# Patient Record
Sex: Male | Born: 1970 | Hispanic: Yes | Marital: Single | Smoking: Former smoker
Health system: Southern US, Community
[De-identification: ages and names within clinical notes are randomized; demographics above are authoritative.]

## PROBLEM LIST (undated history)

## (undated) DIAGNOSIS — K219 Gastro-esophageal reflux disease without esophagitis: Secondary | ICD-10-CM

## (undated) DIAGNOSIS — I10 Essential (primary) hypertension: Secondary | ICD-10-CM

## (undated) DIAGNOSIS — Z0189 Encounter for other specified special examinations: Secondary | ICD-10-CM

## (undated) HISTORY — DX: Gastro-esophageal reflux disease without esophagitis: K21.9

## (undated) HISTORY — DX: Essential (primary) hypertension: I10

## (undated) HISTORY — DX: Encounter for other specified special examinations: Z01.89

---

## 2015-01-04 ENCOUNTER — Ambulatory Visit (INDEPENDENT_AMBULATORY_CARE_PROVIDER_SITE_OTHER): Payer: Managed Care, Other (non HMO) | Admitting: Osteopathic Medicine

## 2015-01-04 ENCOUNTER — Encounter: Payer: Self-pay | Admitting: Osteopathic Medicine

## 2015-01-04 VITALS — BP 122/77 | HR 79 | Temp 97.6°F | Ht 70.0 in | Wt 179.8 lb

## 2015-01-04 DIAGNOSIS — Z0189 Encounter for other specified special examinations: Secondary | ICD-10-CM

## 2015-01-04 DIAGNOSIS — Z23 Encounter for immunization: Secondary | ICD-10-CM | POA: Diagnosis not present

## 2015-01-04 DIAGNOSIS — Z0000 Encounter for general adult medical examination without abnormal findings: Secondary | ICD-10-CM | POA: Diagnosis not present

## 2015-01-04 DIAGNOSIS — Z008 Encounter for other general examination: Secondary | ICD-10-CM | POA: Insufficient documentation

## 2015-01-04 HISTORY — DX: Encounter for other general examination: Z00.8

## 2015-01-04 NOTE — Patient Instructions (Addendum)
YOU HAVE BEEN GIVEN FORMS TO HAVE LAB TESTS DONE, PLEASE GET THESE DONE FASTING (NO FOOD FOR 8 HOURS). THE LAB IS ON THE LOWER LEVEL OF THIS BUILDING AND IS OPEN 8:00 AM - 5:00 PM. WE WILL CALL YOU WITH THE RESULTS OF YOUR LAB WORK, AND WE WILL FINISH FILLING OUT THE PAPERWORK AND SEND IT TO YOUR INSURANCE COMPANY FOR YOU. PLEASE LET Alex Wyatt KNOW IF YOU HAVE NOT HEARD BACK ABOUT YOUR LAB TESTS IN ONE WEEK.   CALL YOUR INSURANCE COMPANY TO ASK IF THE PNEUMONIA VACCINE IS COVERED - SINCE YOU SMOKE CIGARETTES IT IS RECOMMENDED THAT YOU GET THIS VACCINE.   DR. Jonn ShinglesHARLES STANFIELD IS A RECOMMENDED DENTIST IN [place] (CLOSE TO [place]). TRY CALLING HIS OFFICE TO ASK ABOUT COVERAGE AND TO SCHEDULE AN APPOINTMENT [phone].  PLEASE CALL OUR OFFICE WITH ANY QUESTIONS OR CONCERNS!

## 2015-01-04 NOTE — Progress Notes (Signed)
HPI: Alex Wyatt is a 44 y.o. male who presents to [facility]  today for chief complaint of:  Chief Complaint  Patient presents with  . New Patient (Initial Visit)    Needs lab work   No complaints. Here for biometric screening for work.  Preventive care reviewed as below.    Past medical, social and family history reviewed: No past medical history on file. No past surgical history on file. Social History  Substance Use Topics  . Smoking status: Never Smoker   . Smokeless tobacco: Not on file  . Alcohol Use: Not on file   No family history on file.  No current outpatient prescriptions on file.   No current facility-administered medications for this visit.   No Known Allergies    Review of Systems: CONSTITUTIONAL:  No  fever, no chills, No  unintentional weight changes HEAD/EYES/EARS/NOSE/THROAT: No headache, no vision change, no hearing change, No  sore throat CARDIAC: No chest pain, no pressure/palpitations, no orthopnea RESPIRATORY: No  cough, No  shortness of breath/wheeze GASTROINTESTINAL: No nausea, no vomiting, no abdominal pain, no blood in stool, no diarrhea, no constipation MUSCULOSKELETAL: No  myalgia/arthralgia GENITOURINARY: No incontinence, No abnormal genital bleeding/discharge SKIN: No rash/wounds/concerning lesions HEM/ONC: No easy bruising/bleeding, no abnormal lymph node ENDOCRINE: No polyuria/polydipsia/polyphagia, no heat/cold intolerance  NEUROLOGIC: No weakness, no dizziness, no slurred speech PSYCHIATRIC: No concerns with depression, no concerns with anxiety, no sleep problems    Exam:  BP 122/77 mmHg  Pulse 79  Temp(Src) 97.6 F (36.4 C)  Ht 5' 10"  (1.778 m)  Wt 179 lb 12 oz (81.534 kg)  BMI 25.79 kg/m2 Constitutional: VSS, see above. General Appearance: alert, well-developed, well-nourished, NAD Eyes: Normal lids and conjunctive, non-icteric sclera, PERRLA Ears, Nose, Mouth, Throat: Normal external  inspection ears/nares/mouth/lips/gums, TM normal bilaterally, MMM, posterior pharynx No  erythema No exudate Neck: No masses, trachea midline. No thyroid enlargement/tenderness/mass appreciated. No lymphadenopathy Respiratory: Normal respiratory effort. no wheeze, no rhonchi, no rales Cardiovascular: S1/S2 normal, no murmur, no rub/gallop auscultated. RRR. No lower extremity edema. Gastrointestinal: Nontender, no masses. No hepatomegaly, no splenomegaly. No hernia appreciated. Bowel sounds normal. Rectal exam deferred.  Musculoskeletal: Gait normal. No clubbing/cyanosis of digits.  Neurological: No cranial nerve deficit on limited exam. Motor and sensation intact and symmetric Psychiatric: Normal judgment/insight. Normal mood and affect. Oriented x3.    No results found for this or any previous visit (from the past 72 hour(s)).    ASSESSMENT/PLAN:  Annual physical exam - Plan: Lipid panel, COMPLETE METABOLIC PANEL WITH GFR, HIV antibody  Encounter for biometric screening - Plan: Lipid panel, COMPLETE METABOLIC PANEL WITH GFR  Encounter for immunization - flu and Td given today, pt wants to check with insurance re: PNA vax    MALE PREVENTIVE CARE  ANNUAL SCREENING/COUNSELING Tobacco - Current  Smoked 1 packs per day for 30 years , now about 1 pack per week.  Alcohol - social drinker Diet/Exercise - HEALTHY HABITS DISCUSSED TO DECREASE CV RISK Sexual Health - No STI - The patient denies history of sexually transmitted disease. INTERESTED IN STI TESTING - no Depression - PQH2 Negative Domestic violence concerns - no HTN SCREENING - SEE VITALS Vaccination status - SEE BELOW  INFECTIOUS DISEASE SCREENING HIV - all adults 15-65 - needs GC/CT - sexually active - does not need HepC - born 72-1966 - does not need TB - if risk/required by employer - does not need  DISEASE SCREENING Lipid - (Low risk screen  M35; High risk screen M25 if HTN, Tob, FH CHD M<55/F<65) - needs DM2 (45+  or Risk = FH 1st deg DM, Hx GDM, overweight/sedentary, high-risk ethnicity, HTN) - needs Osteoporosis - age 33+ or one sooner if risk - does not need AAA - once age 51-75 if smoker - does not need  CANCER SCREENING Lung - annual low dose CT Chest age 23-85 w/ 30+ PY, current/quit past 15 years - CT - does not need Colon - age 38+ or 45 years of age prior to [place] Dx - GI REFERRAL - does not need Prostate - (shared decision making age 60 in average-risk, age 1 to 75 high risk black men, men with a family history of prostate cancer younger than age 37, BRCA1 or BRCA2) - does not need   ADULT VACCINATION Influenza - annual - was given Td booster every 10 years - was given HPV - age <40yo - was not indicated Zoster - age 37+ - was not indicated Pneumonia - age 45+ sooner if risk (DM, smoker, other) - was offered and declined by the patient  OTHER Fall - exercise and Vit D age 37+ - does not need Consider ASA - age 52-59 - does not need  Return in about 1 year (around 01/04/2016), or sooner if any concerns, for ANNUAL PHYSICAL EXAM.

## 2015-01-05 LAB — HIV ANTIBODY (ROUTINE TESTING W REFLEX): HIV: NONREACTIVE

## 2015-01-05 LAB — COMPLETE METABOLIC PANEL WITH GFR
ALT: 13 U/L (ref 9–46)
AST: 21 U/L (ref 10–40)
Albumin: 4 g/dL (ref 3.6–5.1)
Alkaline Phosphatase: 59 U/L (ref 40–115)
BUN: 15 mg/dL (ref 7–25)
CALCIUM: 9 mg/dL (ref 8.6–10.3)
CHLORIDE: 106 mmol/L (ref 98–110)
CO2: 25 mmol/L (ref 20–31)
CREATININE: 0.91 mg/dL (ref 0.60–1.35)
GFR, Est African American: >89 mL/min (ref >=60)
GFR, Est Non African American: >89 mL/min (ref >=60)
GLUCOSE: 88 mg/dL (ref 65–99)
POTASSIUM: 4.2 mmol/L (ref 3.5–5.3)
SODIUM: 138 mmol/L (ref 135–146)
Total Bilirubin: 0.8 mg/dL (ref 0.2–1.2)
Total Protein: 6.9 g/dL (ref 6.1–8.1)

## 2015-01-05 LAB — LIPID PANEL
CHOLESTEROL: 140 mg/dL (ref 125–200)
HDL: 40 mg/dL (ref >=40)
LDL Cholesterol: 81 mg/dL (ref <130)
Total CHOL/HDL Ratio: 3.5 Ratio (ref <=5.0)
Triglycerides: 94 mg/dL (ref <150)
VLDL: 19 mg/dL (ref <30)

## 2015-01-07 NOTE — Progress Notes (Signed)
Left message to call back  

## 2016-05-09 ENCOUNTER — Encounter: Payer: Self-pay | Admitting: Osteopathic Medicine

## 2016-05-09 ENCOUNTER — Ambulatory Visit (INDEPENDENT_AMBULATORY_CARE_PROVIDER_SITE_OTHER): Payer: Managed Care, Other (non HMO) | Admitting: Osteopathic Medicine

## 2016-05-09 VITALS — BP 124/76 | HR 74 | Ht 70.0 in | Wt 190.0 lb

## 2016-05-09 DIAGNOSIS — Z0189 Encounter for other specified special examinations: Secondary | ICD-10-CM | POA: Diagnosis not present

## 2016-05-09 DIAGNOSIS — Z0000 Encounter for general adult medical examination without abnormal findings: Secondary | ICD-10-CM | POA: Diagnosis not present

## 2016-05-09 DIAGNOSIS — Z008 Encounter for other general examination: Secondary | ICD-10-CM

## 2016-05-09 NOTE — Progress Notes (Addendum)
HPI: Alex Wyatt is a 46 y.o. male  who presents to [facility] today, 05/09/16,  for chief complaint of:  Chief Complaint  Patient presents with  . Annual Exam     Patient presents to clinic for annual physical/biometric screening as required by employer. No complaints today.  No major health changes since last visit other than he has quit smoking. No changes to family history.  Past medical, surgical, social and family history reviewed: Patient Active Problem List   Diagnosis Date Noted  . Encounter for biometric screening 01/04/2015   No past surgical history on file. Social History  Substance Use Topics  . Smoking status: Current Every Day Smoker  . Smokeless tobacco: Current User  . Alcohol use Not on file   No family history on file.   Current medication list and allergy/intolerance information reviewed:   No current outpatient prescriptions on file.   No current facility-administered medications for this visit.    No Known Allergies    Review of Systems:  Constitutional:  No  fever, no chills, No recent illness, No unintentional weight changes. No significant fatigue.   HEENT: No  headache, no vision change, no hearing change, No sore throat, No  sinus pressure   Cardiac: No  chest pain, No  pressure, No palpitations  Respiratory:  No  shortness of breath. No  Cough  Gastrointestinal: No  abdominal pain, No  nausea, No  vomiting,  No  blood in stool, No  diarrhea, No  constipation   Musculoskeletal: No new myalgia/arthralgia  Skin: No  Rash,  Neurologic: No  weakness, No  dizziness  Psychiatric: No  concerns with depression, No  concerns with anxiety  Exam:  BP 124/76   Pulse 74   Ht 5\' 10"  (1.778 m)   Wt 190 lb (86.2 kg)   BMI 27.26 kg/m   Constitutional: VS see above. General Appearance: alert, well-developed, well-nourished, NAD  Eyes: Normal lids and conjunctive, non-icteric sclera  Ears, Nose, Mouth,  Throat: MMM, Normal external inspection ears/nares/mouth/lips/gums. TM normal bilaterally. Pharynx/tonsils no erythema, no exudate. Nasal mucosa normal.   Neck: No masses, trachea midline. No thyroid enlargement. No tenderness/mass appreciated. No lymphadenopathy  Respiratory: Normal respiratory effort. no wheeze, no rhonchi, no rales  Cardiovascular: S1/S2 normal, no murmur, no rub/gallop auscultated. RRR. No lower extremity edema.   Gastrointestinal: Nontender, no masses. No hepatomegaly, no splenomegaly. No hernia appreciated. Bowel sounds normal. Rectal exam deferred.   Musculoskeletal: Gait normal. No clubbing/cyanosis of digits.   Neurological: Normal balance/coordination. No tremor  Skin: warm, dry, intact. No rash/ulcer. No concerning nevi or subq nodules on limited exam.    Psychiatric: Normal judgment/insight. Normal mood and affect. Oriented x3.      ASSESSMENT/PLAN:    Encounter for biometric screening - Plan: COMPLETE METABOLIC PANEL WITH GFR, Lipid panel  Annual physical exam   MALE PREVENTIVE CARE  updated 05/09/16  ANNUAL SCREENING/COUNSELING  Any changes to health in the past year? no  Diet/Exercise - HEALTHY HABITS DISCUSSED TO DECREASE CV RISK History  Smoking Status  . Current Every Day Smoker  Smokeless Tobacco  . Current User   History  Alcohol use Not on file   [facility] 05/09/2016  Decreased Interest 0  Down, Depressed, Hopeless 0  PHQ - 2 Score 0    SEXUAL/REPRODUCTIVE HEALTH  Sexually active in the past year? - Yes with male.  STI testing needed/desired today? - no  Any concerns with testosterone/libido? -  no  INFECTIOUS DISEASE SCREENING  HIV - does not need  GC/CT - does not need  HepC - does not need  TB - does not need  CANCER SCREENING  Lung - does not need  Colon - does not need  Prostate - does not need  OTHER DISEASE SCREENING  Lipid - needs  DM2 - needs  AAA - 65-75yo ever smoked:  does not need  ADULT VACCINATION  Influenza - annual vaccine recommended  Td - booster every 10 years   Zoster - option at 37, yes at 60+   PCV13 - was not indicated  PPSV23 - was not indicated Immunization History  Administered Date(s) Administered  . Influenza,inj,Quad PF,36+ Mos 01/04/2015  . Tdap 01/04/2015      Visit summary with medication list and pertinent instructions was printed for patient to review. All questions at time of visit were answered - patient instructed to contact office with any additional concerns. ER/RTC precautions were reviewed with the patient. Follow-up plan: Return in about 1 year (around 05/09/2017) for [facility] PHYSICAL, sooner if needed.

## 2016-05-10 LAB — COMPLETE METABOLIC PANEL WITH GFR
ALT: 14 U/L (ref 9–46)
AST: 20 U/L (ref 10–40)
Albumin: 4.3 g/dL (ref 3.6–5.1)
Alkaline Phosphatase: 69 U/L (ref 40–115)
BILIRUBIN TOTAL: 0.9 mg/dL (ref 0.2–1.2)
BUN: 21 mg/dL (ref 7–25)
CALCIUM: 9.4 mg/dL (ref 8.6–10.3)
CHLORIDE: 104 mmol/L (ref 98–110)
CO2: 26 mmol/L (ref 20–31)
CREATININE: 1.1 mg/dL (ref 0.60–1.35)
GFR, Est African American: >89 mL/min (ref >=60)
GFR, Est Non African American: 80 mL/min (ref >=60)
Glucose, Bld: 82 mg/dL (ref 65–99)
Potassium: 4.5 mmol/L (ref 3.5–5.3)
Sodium: 139 mmol/L (ref 135–146)
TOTAL PROTEIN: 7.6 g/dL (ref 6.1–8.1)

## 2016-05-10 LAB — LIPID PANEL
CHOLESTEROL: 163 mg/dL (ref <200)
HDL: 48 mg/dL (ref >40)
LDL CALC: 99 mg/dL (ref <100)
Total CHOL/HDL Ratio: 3.4 Ratio (ref <5.0)
Triglycerides: 80 mg/dL (ref <150)
VLDL: 16 mg/dL (ref <30)

## 2016-06-15 ENCOUNTER — Ambulatory Visit (INDEPENDENT_AMBULATORY_CARE_PROVIDER_SITE_OTHER): Payer: Managed Care, Other (non HMO) | Admitting: Sports Medicine

## 2016-06-15 ENCOUNTER — Encounter: Payer: Self-pay | Admitting: Sports Medicine

## 2016-06-15 DIAGNOSIS — J0100 Acute maxillary sinusitis, unspecified: Secondary | ICD-10-CM

## 2016-06-15 MED ORDER — PREDNISONE 50 MG PO TABS: ORAL_TABLET | ORAL | 0 refills | Status: DC

## 2016-06-15 MED ORDER — AMOXICILLIN-POT CLAVULANATE 875-125 MG PO TABS: 1.0000 | ORAL_TABLET | Freq: Two times a day (BID) | ORAL | 0 refills | Status: DC

## 2016-06-15 NOTE — Progress Notes (Signed)
  Subjective:    CC: Feeling sick  HPI:  Last week this pleasant 46 year old male felt facial pain and pressure, sore throat, this improved, and then over the last several days has worsened severely to the point where he is having myalgias, malaise, fatigue, and severe sinus pain and pressure, with fevers well overall 100. No GI symptoms, shortness of breath, no cough. No rash. No neck stiffness.  Past medical history:  Negative.  See flowsheet/record as well for more information.  Surgical history: Negative.  See flowsheet/record as well for more information.  Family history: Negative.  See flowsheet/record as well for more information.  Social history: Negative.  See flowsheet/record as well for more information.  Allergies, and medications have been entered into the medical record, reviewed, and no changes needed.   Review of Systems: No fevers, chills, night sweats, weight loss, chest pain, or shortness of breath.   Objective:    General: Well Developed, well nourished, and in no acute distress.  Neuro: Alert and oriented x3, extra-ocular muscles intact, sensation grossly intact.  HEENT: Normocephalic, atraumatic, pupils equal round reactive to light, neck supple, no masses, thyroid nonpalpable. Oropharynx, ear canals unremarkable, turbinates are boggy and erythematous with significant amounts of mucus, he does have severe and tender cervical lymphadenopathy. Full neck range of motion without stiffness.  Skin: Warm and dry, no rashes. Cardiac: Regular rate and rhythm, no murmurs rubs or gallops, no lower extremity edema.  Respiratory: Clear to auscultation bilaterally. Not using accessory muscles, speaking in full sentences.  Impression and Recommendations:    Acute non-recurrent maxillary sinusitis With marketed tenderness over the left frontal and maxillary sinuses.  Also has the classic double sickening. Augmentin, prednisone. Out of work for 3 days. If insufficient improvement  by the middle of next week we will do a CT scan of the neck and soft tissues looking for retropharyngeal abscess, he did have significant left jugulodigastric lymph node tenderness and some difficulty swallowing.

## 2016-06-15 NOTE — Assessment & Plan Note (Addendum)
With marketed tenderness over the left frontal and maxillary sinuses.  Also has the classic double sickening. Augmentin, prednisone. Out of work for 3 days. If insufficient improvement by the middle of next week we will do a CT scan of the neck and soft tissues looking for retropharyngeal abscess, he did have significant left jugulodigastric lymph node tenderness and some difficulty swallowing.

## 2016-06-15 NOTE — Patient Instructions (Signed)

## 2016-06-22 ENCOUNTER — Ambulatory Visit (INDEPENDENT_AMBULATORY_CARE_PROVIDER_SITE_OTHER): Payer: Managed Care, Other (non HMO) | Admitting: Sports Medicine

## 2016-06-22 ENCOUNTER — Encounter: Payer: Self-pay | Admitting: Sports Medicine

## 2016-06-22 DIAGNOSIS — J0100 Acute maxillary sinusitis, unspecified: Secondary | ICD-10-CM

## 2016-06-22 LAB — CBC WITH DIFFERENTIAL/PLATELET
Basophils Absolute: 0 {cells}/uL (ref 0–200)
Basophils Relative: 0 %
Eosinophils Absolute: 222 cells/uL (ref 15–500)
Eosinophils Relative: 2 %
HCT: 41.4 % (ref 38.5–50.0)
Hemoglobin: 14.2 g/dL (ref 13.2–17.1)
Lymphocytes Relative: 25 %
Lymphs Abs: 2775 cells/uL (ref 850–3900)
MCH: 28.6 pg (ref 27.0–33.0)
MCHC: 34.3 g/dL (ref 32.0–36.0)
MCV: 83.3 fL (ref 80.0–100.0)
MPV: 8.8 fL (ref 7.5–12.5)
Monocytes Absolute: 555 cells/uL (ref 200–950)
Monocytes Relative: 5 %
Neutro Abs: 7548 cells/uL (ref 1500–7800)
Neutrophils Relative %: 68 %
Platelets: 352 K/uL (ref 140–400)
RBC: 4.97 MIL/uL (ref 4.20–5.80)
RDW: 13.7 % (ref 11.0–15.0)
WBC: 11.1 10*3/uL — ABNORMAL HIGH (ref 3.8–10.8)

## 2016-06-22 LAB — COMPREHENSIVE METABOLIC PANEL
ALT: 14 U/L (ref 9–46)
BUN: 19 mg/dL (ref 7–25)
Chloride: 102 mmol/L (ref 98–110)
Creat: 1.04 mg/dL (ref 0.60–1.35)
Sodium: 140 mmol/L (ref 135–146)
Total Bilirubin: 0.7 mg/dL (ref 0.2–1.2)
Total Protein: 7.2 g/dL (ref 6.1–8.1)

## 2016-06-22 LAB — COMPREHENSIVE METABOLIC PANEL WITH GFR
AST: 16 U/L (ref 10–40)
Albumin: 4 g/dL (ref 3.6–5.1)
Alkaline Phosphatase: 67 U/L (ref 40–115)
CO2: 24 mmol/L (ref 20–31)
Calcium: 9.5 mg/dL (ref 8.6–10.3)
Glucose, Bld: 72 mg/dL (ref 65–99)
Potassium: 4.8 mmol/L (ref 3.5–5.3)

## 2016-06-22 MED ORDER — MECLIZINE HCL 25 MG PO TABS: 25.0000 mg | ORAL_TABLET | Freq: Three times a day (TID) | ORAL | 3 refills | Status: DC | PRN

## 2016-06-22 NOTE — Progress Notes (Signed)
  Subjective:    CC: followup  HPI: Sinusitis: Severe at the last visit.  We gave him augmentin and prednisone.  He has improved significantly, tells me he feels a LOT better.  He is having some vague dizziness described more as vertiginous.  Moderate, persistent.  No headaches, no visual changes.  Past medical history:  Negative.  See flowsheet/record as well for more information.  Surgical history: Negative.  See flowsheet/record as well for more information.  Family history: Negative.  See flowsheet/record as well for more information.  Social history: Negative.  See flowsheet/record as well for more information.  Allergies, and medications have been entered into the medical record, reviewed, and no changes needed.   Review of Systems: No fevers, chills, night sweats, weight loss, chest pain, or shortness of breath.   Objective:    General: Well Developed, well nourished, and in no acute distress.  Neuro: Alert and oriented x3, extra-ocular muscles intact, sensation grossly intact. CN 2-12 intact.  Motor, sensory, and coordinative functions all intact and nonfocal. HEENT: Normocephalic, atraumatic, pupils equal round reactive to light, neck supple, no masses, no lymphadenopathy, thyroid nonpalpable.  Skin: Warm and dry, no rashes. Cardiac: Regular rate and rhythm, no murmurs rubs or gallops, no lower extremity edema.  Respiratory: Clear to auscultation bilaterally. Not using accessory muscles, speaking in full sentences.  Impression and Recommendations:    Acute non-recurrent maxillary sinusitis Fantastic improvement after 1 week of Augmentin and prednisone. Still having some dizziness consistent with vertigo. Getting some blood work and adding meclizine.

## 2016-06-22 NOTE — Assessment & Plan Note (Signed)
Fantastic improvement after 1 week of Augmentin and prednisone. Still having some dizziness consistent with vertigo. Getting some blood work and adding meclizine.

## 2016-06-23 LAB — TSH: TSH: 1.33 mIU/L (ref 0.40–4.50)

## 2016-06-23 LAB — VITAMIN B12: Vitamin B-12: 347 pg/mL (ref 200–1100)

## 2016-07-05 ENCOUNTER — Encounter: Payer: Self-pay | Admitting: Sports Medicine

## 2016-07-05 ENCOUNTER — Ambulatory Visit (INDEPENDENT_AMBULATORY_CARE_PROVIDER_SITE_OTHER): Payer: Managed Care, Other (non HMO) | Admitting: Sports Medicine

## 2016-07-05 DIAGNOSIS — J0100 Acute maxillary sinusitis, unspecified: Secondary | ICD-10-CM

## 2016-07-05 NOTE — Progress Notes (Signed)
  Subjective:    CC: Follow-up  HPI: Dizziness: This followed sinus infection, not surprisingly is resolved with a bit of meclizine.  Past medical history:  Negative.  See flowsheet/record as well for more information.  Surgical history: Negative.  See flowsheet/record as well for more information.  Family history: Negative.  See flowsheet/record as well for more information.  Social history: Negative.  See flowsheet/record as well for more information.  Allergies, and medications have been entered into the medical record, reviewed, and no changes needed.   Review of Systems: No fevers, chills, night sweats, weight loss, chest pain, or shortness of breath.   Objective:    General: Well Developed, well nourished, and in no acute distress.  Neuro: Alert and oriented x3, extra-ocular muscles intact, sensation grossly intact.  HEENT: Normocephalic, atraumatic, pupils equal round reactive to light, neck supple, no masses, no lymphadenopathy, thyroid nonpalpable.  Skin: Warm and dry, no rashes. Cardiac: Regular rate and rhythm, no murmurs rubs or gallops, no lower extremity edema.  Respiratory: Clear to auscultation bilaterally. Not using accessory muscles, speaking in full sentences.  Impression and Recommendations:    Acute non-recurrent maxillary sinusitis Sinus symptoms and dizziness are now resolved, return as needed. He is heading into an IRS audit.

## 2016-07-05 NOTE — Assessment & Plan Note (Signed)
Sinus symptoms and dizziness are now resolved, return as needed. He is heading into an IRS audit.

## 2017-04-09 ENCOUNTER — Encounter: Payer: Self-pay | Admitting: Osteopathic Medicine

## 2017-04-09 ENCOUNTER — Ambulatory Visit (INDEPENDENT_AMBULATORY_CARE_PROVIDER_SITE_OTHER): Payer: 59 | Admitting: Osteopathic Medicine

## 2017-04-09 VITALS — BP 136/92 | HR 80 | Temp 98.2°F | Wt 192.0 lb

## 2017-04-09 DIAGNOSIS — Z0000 Encounter for general adult medical examination without abnormal findings: Secondary | ICD-10-CM

## 2017-04-09 DIAGNOSIS — Z0189 Encounter for other specified special examinations: Secondary | ICD-10-CM

## 2017-04-09 DIAGNOSIS — Z008 Encounter for other general examination: Secondary | ICD-10-CM

## 2017-04-09 LAB — COMPLETE METABOLIC PANEL WITH GFR
AG Ratio: 1.4 (calc) (ref 1.0–2.5)
ALKALINE PHOSPHATASE (APISO): 74 U/L (ref 40–115)
ALT: 13 U/L (ref 9–46)
AST: 24 U/L (ref 10–40)
Albumin: 4.4 g/dL (ref 3.6–5.1)
BILIRUBIN TOTAL: 1.3 mg/dL — AB (ref 0.2–1.2)
BUN: 11 mg/dL (ref 7–25)
CHLORIDE: 104 mmol/L (ref 98–110)
CO2: 31 mmol/L (ref 20–32)
CREATININE: 1.01 mg/dL (ref 0.60–1.35)
Calcium: 9.4 mg/dL (ref 8.6–10.3)
GFR, Est African American: 102 mL/min/{1.73_m2} (ref >=60)
GFR, Est Non African American: 88 mL/min/{1.73_m2} (ref >=60)
GLOBULIN: 3.1 g/dL (ref 1.9–3.7)
GLUCOSE: 89 mg/dL (ref 65–99)
Potassium: 4.2 mmol/L (ref 3.5–5.3)
SODIUM: 139 mmol/L (ref 135–146)
Total Protein: 7.5 g/dL (ref 6.1–8.1)

## 2017-04-09 LAB — CBC
HEMATOCRIT: 42.3 % (ref 38.5–50.0)
Hemoglobin: 14.5 g/dL (ref 13.2–17.1)
MCH: 28.7 pg (ref 27.0–33.0)
MCHC: 34.3 g/dL (ref 32.0–36.0)
MCV: 83.6 fL (ref 80.0–100.0)
MPV: 9.9 fL (ref 7.5–12.5)
Platelets: 266 10*3/uL (ref 140–400)
RBC: 5.06 10*6/uL (ref 4.20–5.80)
RDW: 12.1 % (ref 11.0–15.0)
WBC: 7.3 10*3/uL (ref 3.8–10.8)

## 2017-04-09 LAB — LIPID PANEL
CHOL/HDL RATIO: 3 (calc) (ref <5.0)
CHOLESTEROL: 152 mg/dL (ref <200)
HDL: 51 mg/dL (ref >40)
LDL Cholesterol (Calc): 86 mg/dL (calc)
Non-HDL Cholesterol (Calc): 101 mg/dL (calc) (ref <130)
Triglycerides: 64 mg/dL (ref <150)

## 2017-04-09 NOTE — Progress Notes (Signed)
HPI: Alex Wyatt is a 47 y.o. male  who [facility] today, 04/09/17,  for chief complaint of:  Annual physical / biometrics form needs filled out   Patient presents to clinic for annual physical/biometric screening as required by employer. No complaints today.  No health changes since last visit. No changes to family history.    Past medical, surgical, social and family history reviewed: Patient Active Problem List   Diagnosis Date Noted  . Acute non-recurrent maxillary sinusitis 06/15/2016  . Encounter for biometric screening 01/04/2015   History reviewed. No pertinent surgical history.  Social History   Tobacco Use  . Smoking status: Former Smoker    Types: Cigarettes    Last attempt to quit: 06/13/2015    Years since quitting: 1.8  . Smokeless tobacco: Former Engineer, waterUser  Substance Use Topics  . Alcohol use: Not on file   History reviewed. No pertinent family history.     Current medication list and allergy/intolerance information reviewed:   Current Outpatient Medications  Medication Sig Dispense Refill  . meclizine (ANTIVERT) 25 MG tablet Take 1 tablet (25 mg total) by mouth 3 (three) times daily as needed for dizziness or nausea. (Patient not taking: Reported on 04/09/2017) 30 tablet 3   No current facility-administered medications for this visit.    No Known Allergies    Review of Systems:  Constitutional:  No  fever, no chills, No recent illness, No unintentional weight changes. No significant fatigue.   HEENT: No  headache, no vision change, no hearing change, No sore throat, No  sinus pressure   Cardiac: No  chest pain, No  pressure, No palpitations  Respiratory:  No  shortness of breath. No  Cough  Gastrointestinal: No  abdominal pain, No  nausea, No  vomiting,  No  blood in stool, No  diarrhea, No  constipation   Musculoskeletal: No new myalgia/arthralgia  Skin: No  Rash,  Neurologic: No  weakness, No   dizziness  Psychiatric: No  concerns with depression, No  concerns with anxiety  Exam:  BP (!) 136/92 (BP Location: Left Arm)   Pulse 80   Temp 98.2 F (36.8 C) (Oral)   Wt 192 lb 0.6 oz (87.1 kg)   BMI 27.55 kg/m   Constitutional: VS see above. General Appearance: alert, well-developed, well-nourished, NAD  Eyes: Normal lids and conjunctive, non-icteric sclera  Ears, Nose, Mouth, Throat: MMM, Normal external inspection ears/nares/mouth/lips/gums. TM normal bilaterally. Pharynx/tonsils no erythema, no exudate. Nasal mucosa normal.   Neck: No masses, trachea midline. No thyroid enlargement. No tenderness/mass appreciated. No lymphadenopathy  Respiratory: Normal respiratory effort. no wheeze, no rhonchi, no rales  Cardiovascular: S1/S2 normal, no murmur, no rub/gallop auscultated. RRR. No lower extremity edema.   Gastrointestinal: Nontender, no masses. No hepatomegaly, no splenomegaly. No hernia appreciated. Bowel sounds normal. Rectal exam deferred.   Musculoskeletal: Gait normal. No clubbing/cyanosis of digits.   Neurological: Normal balance/coordination. No tremor  Skin: warm, dry, intact. No rash/ulcer. No concerning nevi or subq nodules on limited exam.    Psychiatric: Normal judgment/insight. Normal mood and affect. Oriented x3.      ASSESSMENT/PLAN:   Annual physical exam - Plan: CBC, COMPLETE METABOLIC PANEL WITH GFR, Lipid panel  Encounter for biometric screening - Plan: CBC, COMPLETE METABOLIC PANEL WITH GFR, Lipid panel   MALE PREVENTIVE CARE  updated 04/09/17  ANNUAL SCREENING/COUNSELING  Any changes to health in the past year? no  Diet/Exercise - HEALTHY HABITS DISCUSSED TO  DECREASE CV RISK Social History   Tobacco Use  Smoking Status Former Smoker  . Types: Cigarettes  . Last attempt to quit: 06/13/2015  . Years since quitting: 1.8  Smokeless Tobacco Former Neurosurgeon   Social History   Substance and Sexual Activity  Alcohol Use Not on file    Depression screen [facility] 2/9 04/09/2017  Decreased Interest 0  Down, Depressed, Hopeless 0  PHQ - 2 Score 0  Altered sleeping 3  Tired, decreased energy 1  Change in appetite 3  Feeling bad or failure about yourself  0  Trouble concentrating 0  Moving slowly or fidgety/restless 0  Suicidal thoughts 0  PHQ-9 Score 7    SEXUAL/REPRODUCTIVE HEALTH  Sexually active in the past year? - Yes with male.  STI testing needed/desired today? - no  Any concerns with testosterone/libido? - no  INFECTIOUS DISEASE SCREENING  HIV - does not need  GC/CT - does not need  HepC - does not need  TB - does not need  CANCER SCREENING  Lung - does not need  Colon - does not need  Prostate - does not need  OTHER DISEASE SCREENING  Lipid - needs  DM2 - needs  AAA - 65-75yo ever smoked: does not need  ADULT VACCINATION  Influenza - annual vaccine recommended  Td - booster every 10 years   Zoster - option at 23, yes at 60+   PCV13 - was not indicated  PPSV23 - was not indicated Immunization History  Administered Date(s) Administered  . Influenza,inj,Quad PF,6+ Mos 01/04/2015  . Tdap 01/04/2015      Visit summary with medication list and pertinent instructions was printed for patient to review. All questions at time of visit were answered - patient instructed to contact office with any additional concerns. ER/RTC precautions were reviewed with the patient. Follow-up plan: No Follow-up on file.

## 2018-03-06 ENCOUNTER — Encounter: Payer: Self-pay | Admitting: Sports Medicine

## 2018-03-06 ENCOUNTER — Ambulatory Visit (INDEPENDENT_AMBULATORY_CARE_PROVIDER_SITE_OTHER): Payer: 59

## 2018-03-06 ENCOUNTER — Ambulatory Visit (INDEPENDENT_AMBULATORY_CARE_PROVIDER_SITE_OTHER): Payer: 59 | Admitting: Sports Medicine

## 2018-03-06 DIAGNOSIS — M25572 Pain in left ankle and joints of left foot: Secondary | ICD-10-CM | POA: Diagnosis not present

## 2018-03-06 DIAGNOSIS — M79672 Pain in left foot: Secondary | ICD-10-CM

## 2018-03-06 MED ORDER — MELOXICAM 15 MG PO TABS: ORAL_TABLET | ORAL | 3 refills | Status: DC

## 2018-03-06 NOTE — Assessment & Plan Note (Signed)
Pain and swelling over the talus, meloxicam, x-rays, Cam boot for 2 weeks. Return for custom molded orthotics.

## 2018-03-06 NOTE — Progress Notes (Signed)
Subjective:    I'm seeing this patient as a consultation for: Dr. Sunnie NielsenNatalie Alexander  CC: Left foot pain  HPI: For a month now this pleasant 48 year old male has had pain that he localizes over the dorsal medial aspect of his left foot, mild swelling, he did bang something on the foot but this is more chronic, he spends all day on his feet, prolonged weightbearing.  Pain is moderate, persistent, localized without radiation.  I reviewed the past medical history, family history, social history, surgical history, and allergies today and no changes were needed.  Please see the problem list section below in epic for further details.  Past Medical History: Past Medical History:  Diagnosis Date  . Encounter for biometric screening 01/04/2015   Past Surgical History: No past surgical history on file. Social History: Social History   Socioeconomic History  . Marital status: Single    Spouse name: Not on file  . Number of children: Not on file  . Years of education: Not on file  . Highest education level: Not on file  Occupational History  . Not on file  Social Needs  . Financial resource strain: Not on file  . Food insecurity:    Worry: Not on file    Inability: Not on file  . Transportation needs:    Medical: Not on file    Non-medical: Not on file  Tobacco Use  . Smoking status: Former Smoker    Types: Cigarettes    Last attempt to quit: 06/13/2015    Years since quitting: 2.7  . Smokeless tobacco: Former Engineer, waterUser  Substance and Sexual Activity  . Alcohol use: Not on file  . Drug use: Not on file  . Sexual activity: Yes  Lifestyle  . Physical activity:    Days per week: Not on file    Minutes per session: Not on file  . Stress: Not on file  Relationships  . Social connections:    Talks on phone: Not on file    Gets together: Not on file    Attends religious service: Not on file    Active member of club or organization: Not on file    Attends meetings of clubs or  organizations: Not on file    Relationship status: Not on file  Other Topics Concern  . Not on file  Social History Narrative  . Not on file   Family History: No family history on file. Allergies: No Known Allergies Medications: See med rec.  Review of Systems: No headache, visual changes, nausea, vomiting, diarrhea, constipation, dizziness, abdominal pain, skin rash, fevers, chills, night sweats, weight loss, swollen lymph nodes, body aches, joint swelling, muscle aches, chest pain, shortness of breath, mood changes, visual or auditory hallucinations.   Objective:   General: Well Developed, well nourished, and in no acute distress.  Neuro:  Extra-ocular muscles intact, able to move all 4 extremities, sensation grossly intact.  Deep tendon reflexes tested were normal. Psych: Alert and oriented, mood congruent with affect. ENT:  Ears and nose appear unremarkable.  Hearing grossly normal. Neck: Unremarkable overall appearance, trachea midline.  No visible thyroid enlargement. Eyes: Conjunctivae and lids appear unremarkable.  Pupils equal and round. Skin: Warm and dry, no rashes noted.  Cardiovascular: Pulses palpable, no extremity edema. Left ankle: Minimally swollen Range of motion is full in all directions. Strength is 5/5 in all directions. Stable lateral and medial ligaments; squeeze test and kleiger test unremarkable; Tender palpation dorsally over the anterior process of  the talus. No pain at base of 5th MT; No tenderness over cuboid; No tenderness over N spot or navicular prominence No tenderness on posterior aspects of lateral and medial malleolus No sign of peroneal tendon subluxations; Negative tarsal tunnel tinel's Able to walk 4 steps.  Impression and Recommendations:   This case required medical decision making of moderate complexity.  Left foot pain Pain and swelling over the talus, meloxicam, x-rays, Cam boot for 2 weeks. Return for custom molded  orthotics.  ___________________________________________ Ihor Austinhomas J. Benjamin Stainhekkekandam, M.D., ABFM., CAQSM. Primary Care and Sports Medicine [facility]  Adjunct Professor of Family Medicine  [employer]

## 2018-03-24 ENCOUNTER — Encounter: Payer: Self-pay | Admitting: Osteopathic Medicine

## 2018-03-24 ENCOUNTER — Ambulatory Visit (INDEPENDENT_AMBULATORY_CARE_PROVIDER_SITE_OTHER): Payer: 59 | Admitting: Osteopathic Medicine

## 2018-03-24 VITALS — BP 131/85 | HR 65 | Temp 98.3°F | Ht 70.0 in | Wt 194.3 lb

## 2018-03-24 DIAGNOSIS — Z0000 Encounter for general adult medical examination without abnormal findings: Secondary | ICD-10-CM

## 2018-03-24 NOTE — Progress Notes (Signed)
HPI: Alex Wyatt is a 48 y.o. male who  has a past medical history of Encounter for biometric screening (01/04/2015).  [facility] today, 03/24/18,  for chief complaint of: Annual physical, biometric screening    Patient here for annual physical / wellness exam.  See preventive care reviewed as below.    Additional concerns today include: none     Past medical, surgical, social and family history reviewed:  Patient Active Problem List   Diagnosis Date Noted  . Left foot pain 03/06/2018  . Encounter for biometric screening 01/04/2015    No past surgical history on file.  Social History   Tobacco Use  . Smoking status: Former Smoker    Types: Cigarettes    Last attempt to quit: 06/13/2015    Years since quitting: 2.7  . Smokeless tobacco: Former Engineer, waterUser  Substance Use Topics  . Alcohol use: Not on file    No family history on file.   Current medication list and allergy/intolerance information reviewed:    Current Outpatient Medications  Medication Sig Dispense Refill  . meloxicam (MOBIC) 15 MG tablet One tab PO qAM with breakfast for 2 weeks, then daily prn pain. (Patient not taking: Reported on 03/24/2018) 30 tablet 3   No current facility-administered medications for this visit.     No Known Allergies    Review of Systems:  Constitutional:  No  fever, no chills, No recent illness, No unintentional weight changes. No significant fatigue.   HEENT: No  headache, no vision change, no hearing change, No sore throat, No  sinus pressure  Cardiac: No  chest pain, No  pressure, No palpitations, No  Orthopnea  Respiratory:  No  shortness of breath. No  Cough  Gastrointestinal: No  abdominal pain, No  nausea, No  vomiting,  No  blood in stool, No  diarrhea, No  constipation   Musculoskeletal: No new myalgia/arthralgia  Skin: No  Rash, No other wounds/concerning lesions  Genitourinary: No  incontinence, No   abnormal genital bleeding, No abnormal genital discharge  Hem/Onc: No  easy bruising/bleeding, No  abnormal lymph node  Endocrine: No cold intolerance,  No heat intolerance. No polyuria/polydipsia/polyphagia   Neurologic: No  weakness, No  dizziness, No  slurred speech/focal weakness/facial droop  Psychiatric: No  concerns with depression, No  concerns with anxiety, No sleep problems, No mood problems  Exam:  BP 131/85 (BP Location: Left Arm, Patient Position: Sitting, Cuff Size: Normal)   Pulse 65   Temp 98.3 F (36.8 C) (Oral)   Ht 5\' 10"  (1.778 m)   Wt 194 lb 4.8 oz (88.1 kg)   BMI 27.88 kg/m   Constitutional: VS see above. General Appearance: alert, well-developed, well-nourished, NAD  Eyes: Normal lids and conjunctive, non-icteric sclera  Ears, Nose, Mouth, Throat: MMM, Normal external inspection ears/nares/mouth/lips/gums. TM normal bilaterally. Pharynx/tonsils no erythema, no exudate. Nasal mucosa normal.   Neck: No masses, trachea midline. No thyroid enlargement. No tenderness/mass appreciated. No lymphadenopathy  Respiratory: Normal respiratory effort. no wheeze, no rhonchi, no rales  Cardiovascular: S1/S2 normal, no murmur, no rub/gallop auscultated. RRR. No lower extremity edema.  Gastrointestinal: Nontender, no masses. No hepatomegaly, no splenomegaly. No hernia appreciated. Bowel sounds normal. Rectal exam deferred.   Musculoskeletal: Gait normal. No clubbing/cyanosis of digits.   Neurological: Normal balance/coordination. No tremor. No cranial nerve deficit on limited exam. Motor and sensation intact and symmetric. Cerebellar reflexes intact.   Skin: warm, dry, intact. No rash/ulcer.  No concerning nevi or subq nodules on limited exam.    Psychiatric: Normal judgment/insight. Normal mood and affect. Oriented x3.        ASSESSMENT/PLAN: The encounter diagnosis was Annual physical exam.   Not fasting - had a soda and a fe chocolates right before coming  in  No concerns, UTD on major vaccines and screenings, need labs today   Orders Placed This Encounter  Procedures  . COMPLETE METABOLIC PANEL WITH GFR  . Lipid panel  . CBC    No orders of the defined types were placed in this encounter.   Patient Instructions  General Preventive Care  Most recent routine screening lipids/other labs: ordered today.   Everyone should have blood pressure checked once per year.   Tobacco: don't!  Alcohol: responsible moderation is ok for most adults - if you have concerns about your alcohol intake, please talk to me!   Exercise: as tolerated to reduce risk of cardiovascular disease and diabetes. Strength training will also prevent osteoporosis.   Mental health: if need for mental health care (medicines, counseling, other), or concerns about moods, please let me know!  Sexual health: if need for STD testing, or if concerns with libido/pain problems, please let me know!   Advanced Directive: Living Will and/or Healthcare Power of Attorney recommended for all adults, regardless of age or health.  Vaccines  Flu vaccine: recommended for almost everyone, every fall.   Shingles vaccine: Shingrix recommended after age 49.   Pneumonia vaccines: Pneumovax recommended after age 49, or sooner if certain medical conditions.  Tetanus booster: Tdap recommended every 10 years. Done 01/2015. Cancer screenings   Colon cancer screening: recommended for everyone at age 49, but some folks need a colonoscopy sooner if risk factors   Prostate cancer screening: PSA blood test around age 49 Infection screenings . HIV & Gonorrhea/Chlamydia: screening as needed.  . Hepatitis C: recommended for anyone born [id number] . TB: certain at-risk populations, or depending on work requirements and/or travel history Other . Bone Density Test: recommended for men at age 49, sooner depending on risk factors . Abdominal Aortic Aneurysm: screening with ultrasound recommended  once for men age 49-75 who have ever smoked             Visit summary with medication list and pertinent instructions was printed for patient to review. All questions at time of visit were answered - patient instructed to contact office with any additional concerns or updates. ER/RTC precautions were reviewed with the patient.     Please note: voice recognition software was used to produce this document, and typos may escape review. Please contact Dr. Lyn HollingsheadAlexander for any needed clarifications.     Follow-up plan: Return in about 1 year (around 03/25/2019) for [facility], sooner if needed.

## 2018-03-24 NOTE — Patient Instructions (Addendum)
General Preventive Care  Most recent routine screening lipids/other labs: ordered today.   Everyone should have blood pressure checked once per year.   Tobacco: don't!  Alcohol: responsible moderation is ok for most adults - if you have concerns about your alcohol intake, please talk to me!   Exercise: as tolerated to reduce risk of cardiovascular disease and diabetes. Strength training will also prevent osteoporosis.   Mental health: if need for mental health care (medicines, counseling, other), or concerns about moods, please let me know!  Sexual health: if need for STD testing, or if concerns with libido/pain problems, please let me know!   Advanced Directive: Living Will and/or Healthcare Power of Attorney recommended for all adults, regardless of age or health.  Vaccines  Flu vaccine: recommended for almost everyone, every fall.   Shingles vaccine: Shingrix recommended after age 32.   Pneumonia vaccines: Pneumovax recommended after age 28, or sooner if certain medical conditions.  Tetanus booster: Tdap recommended every 10 years. Done 01/2015. Cancer screenings   Colon cancer screening: recommended for everyone at age 49, but some folks need a colonoscopy sooner if risk factors   Prostate cancer screening: PSA blood test around age 81 Infection screenings . HIV & Gonorrhea/Chlamydia: screening as needed.  . Hepatitis C: recommended for anyone born 55-1966 . TB: certain at-risk populations, or depending on work requirements and/or travel history Other . Bone Density Test: recommended for men at age 46, sooner depending on risk factors . Abdominal Aortic Aneurysm: screening with ultrasound recommended once for men age 22-75 who have ever smoked

## 2018-03-25 LAB — COMPLETE METABOLIC PANEL WITH GFR
AG Ratio: 1.2 (calc) (ref 1.0–2.5)
ALBUMIN MSPROF: 4.1 g/dL (ref 3.6–5.1)
ALT: 12 U/L (ref 9–46)
AST: 18 U/L (ref 10–40)
Alkaline phosphatase (APISO): 71 U/L (ref 40–115)
BUN: 18 mg/dL (ref 7–25)
CO2: 27 mmol/L (ref 20–32)
Calcium: 9.3 mg/dL (ref 8.6–10.3)
Chloride: 103 mmol/L (ref 98–110)
Creat: 1.2 mg/dL (ref 0.60–1.35)
GFR, Est African American: 82 mL/min/{1.73_m2} (ref >=60)
GFR, Est Non African American: 71 mL/min/{1.73_m2} (ref >=60)
GLUCOSE: 79 mg/dL (ref 65–99)
Globulin: 3.3 g/dL (calc) (ref 1.9–3.7)
Potassium: 4.3 mmol/L (ref 3.5–5.3)
Sodium: 140 mmol/L (ref 135–146)
TOTAL PROTEIN: 7.4 g/dL (ref 6.1–8.1)
Total Bilirubin: 0.7 mg/dL (ref 0.2–1.2)

## 2018-03-25 LAB — CBC
HCT: 43 % (ref 38.5–50.0)
HEMOGLOBIN: 14.7 g/dL (ref 13.2–17.1)
MCH: 28.8 pg (ref 27.0–33.0)
MCHC: 34.2 g/dL (ref 32.0–36.0)
MCV: 84.1 fL (ref 80.0–100.0)
MPV: 9.7 fL (ref 7.5–12.5)
Platelets: 304 10*3/uL (ref 140–400)
RBC: 5.11 10*6/uL (ref 4.20–5.80)
RDW: 12 % (ref 11.0–15.0)
WBC: 9.9 10*3/uL (ref 3.8–10.8)

## 2018-03-25 LAB — LIPID PANEL
Cholesterol: 157 mg/dL (ref <200)
HDL: 39 mg/dL — ABNORMAL LOW (ref >40)
LDL Cholesterol (Calc): 90 mg/dL (calc)
Non-HDL Cholesterol (Calc): 118 mg/dL (calc) (ref <130)
Total CHOL/HDL Ratio: 4 (calc) (ref <5.0)
Triglycerides: 187 mg/dL — ABNORMAL HIGH (ref <150)

## 2018-04-01 ENCOUNTER — Ambulatory Visit (INDEPENDENT_AMBULATORY_CARE_PROVIDER_SITE_OTHER): Payer: 59 | Admitting: Sports Medicine

## 2018-04-01 DIAGNOSIS — M79672 Pain in left foot: Secondary | ICD-10-CM

## 2018-04-01 NOTE — Assessment & Plan Note (Signed)
Doing better with meloxicam. Custom orthotics as above. 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 If insufficient relief after 1 month we will proceed with an MRI for interventional planning.

## 2018-04-01 NOTE — Progress Notes (Addendum)
    Patient was fitted for a : standard, cushioned, semi-rigid orthotic. The orthotic was heated and afterward the patient stood on the orthotic blank positioned on the orthotic stand. The patient was positioned in subtalar neutral position and 10 degrees of ankle dorsiflexion in a weight bearing stance. After completion of molding, a stable base was applied to the orthotic blank. The blank was ground to a stable position for weight bearing. Size: 13 Base: White Doctor, hospital and Padding: None The patient ambulated these, and they were very comfortable.  I spent 40 minutes with this patient, greater than 50% was face-to-face time counseling regarding the below diagnosis, specifically diagnosis and treatment options as well as the neck steps regarding his foot and ankle pain.  ___________________________________________ Alex Wyatt. Benjamin Stain, M.D., ABFM., CAQSM. Primary Care and Sports Medicine [facility]  Adjunct Instructor of Family Medicine  [employer]

## 2018-04-29 ENCOUNTER — Encounter: Payer: Self-pay | Admitting: Sports Medicine

## 2018-04-29 ENCOUNTER — Ambulatory Visit (INDEPENDENT_AMBULATORY_CARE_PROVIDER_SITE_OTHER): Payer: 59 | Admitting: Sports Medicine

## 2018-04-29 DIAGNOSIS — M79672 Pain in left foot: Secondary | ICD-10-CM

## 2018-04-29 NOTE — Progress Notes (Signed)
Subjective:    CC: Follow-up  HPI: Alex Wyatt returns, is a pleasant 48 year old male, we treated him for ankle pain and swelling, symptoms were over the dorsal talonavicular joint, x-rays at the time did show some irregularity and spurring over the dorsal talus.  I placed him in a boot for a couple of weeks, we build him custom orthotics and use some NSAIDs, he returns today completely pain-free.  I reviewed the past medical history, family history, social history, surgical history, and allergies today and no changes were needed.  Please see the problem list section below in epic for further details.  Past Medical History: Past Medical History:  Diagnosis Date  . Encounter for biometric screening 01/04/2015   Past Surgical History: No past surgical history on file. Social History: Social History   Socioeconomic History  . Marital status: Single    Spouse name: Not on file  . Number of children: Not on file  . Years of education: Not on file  . Highest education level: Not on file  Occupational History  . Not on file  Social Needs  . Financial resource strain: Not on file  . Food insecurity:    Worry: Not on file    Inability: Not on file  . Transportation needs:    Medical: Not on file    Non-medical: Not on file  Tobacco Use  . Smoking status: Former Smoker    Types: Cigarettes    Last attempt to quit: 06/13/2015    Years since quitting: 2.8  . Smokeless tobacco: Former Engineer, water and Sexual Activity  . Alcohol use: Not on file  . Drug use: Not on file  . Sexual activity: Yes  Lifestyle  . Physical activity:    Days per week: Not on file    Minutes per session: Not on file  . Stress: Not on file  Relationships  . Social connections:    Talks on phone: Not on file    Gets together: Not on file    Attends religious service: Not on file    Active member of club or organization: Not on file    Attends meetings of clubs or organizations: Not on file   Relationship status: Not on file  Other Topics Concern  . Not on file  Social History Narrative  . Not on file   Family History: No family history on file. Allergies: No Known Allergies Medications: See med rec.  Review of Systems: No fevers, chills, night sweats, weight loss, chest pain, or shortness of breath.   Objective:    General: Well Developed, well nourished, and in no acute distress.  Neuro: Alert and oriented x3, extra-ocular muscles intact, sensation grossly intact.  HEENT: Normocephalic, atraumatic, pupils equal round reactive to light, neck supple, no masses, no lymphadenopathy, thyroid nonpalpable.  Skin: Warm and dry, no rashes. Cardiac: Regular rate and rhythm, no murmurs rubs or gallops, no lower extremity edema.  Respiratory: Clear to auscultation bilaterally. Not using accessory muscles, speaking in full sentences.  Impression and Recommendations:    Left foot pain We treated [name] for ankle pain and swelling, symptoms were over the dorsal talonavicular joint, x-rays at the time did show some irregularity and spurring over the dorsal talus.  I placed him in a boot for a couple of weeks, we build him custom orthotics and use some NSAIDs, he returns today completely pain-free. ___________________________________________ Ihor Austin. Benjamin Stain, M.D., ABFM., CAQSM. Primary Care and Sports Medicine [facility] [place]  Adjunct  Professor of Family Medicine  [employer]

## 2018-04-29 NOTE — Assessment & Plan Note (Signed)
We treated Alex Wyatt for ankle pain and swelling, symptoms were over the dorsal talonavicular joint, x-rays at the time did show some irregularity and spurring over the dorsal talus.  I placed him in a boot for a couple of weeks, we build him custom orthotics and use some NSAIDs, he returns today completely pain-free.

## 2018-06-18 ENCOUNTER — Encounter: Payer: Self-pay | Admitting: Osteopathic Medicine

## 2018-06-18 ENCOUNTER — Ambulatory Visit (INDEPENDENT_AMBULATORY_CARE_PROVIDER_SITE_OTHER): Payer: 59 | Admitting: Osteopathic Medicine

## 2018-06-18 VITALS — BP 152/95 | HR 66 | Wt 197.0 lb

## 2018-06-18 DIAGNOSIS — R0602 Shortness of breath: Secondary | ICD-10-CM | POA: Diagnosis not present

## 2018-06-18 DIAGNOSIS — R030 Elevated blood-pressure reading, without diagnosis of hypertension: Secondary | ICD-10-CM | POA: Diagnosis not present

## 2018-06-18 MED ORDER — HYDROCHLOROTHIAZIDE 12.5 MG PO TABS: 12.5000 mg | ORAL_TABLET | Freq: Every day | ORAL | 0 refills | Status: DC

## 2018-06-18 NOTE — Patient Instructions (Signed)
Plan:  Office visit tomorrow to evaluate breathing, check BP and verify home BP monitor. Depending on exam, may consider EKG and/or chest Xray.   Labs today.   Prescription sent for low-dose BP medication, can start tonight or can wait until after you see me tomorrow.   If worsening breathing trouble or if chest pain, please go to ER!

## 2018-06-18 NOTE — Progress Notes (Signed)
Virtual Visit  via Video Note  I connected with      Alex Wyatt on 06/18/18 at 10:10 by a telemedicine application and verified that I am speaking with the correct person.   I discussed the limitations of evaluation and management by telemedicine and the availability of in person appointments. The patient expressed understanding and agreed to proceed.  History of Present Illness: Alex Wyatt is a 48 y.o. male who would like to discuss concern for high BP No chief complaint on file.  152/95 BP today  Measured on home BP cuff: upper arm  CP/SOB, VC, nausea/dizziness (+)headaches Lower since using garlic supplements  When checked first time after buying the machine.  160/90s, then down to 150/95.  Nighttime trouble breathing, feeling like he can't get a deep breath in. Minimal SOB on exertion initially but then gets better. Better when he sits down and calms down.      Observations/Objective: BP (!) 152/95 (BP Location: Left Arm, Patient Position: Sitting, Cuff Size: Normal)   Pulse 66   Wt 197 lb (89.4 kg)   BMI 28.27 kg/m  BP Readings from Last 3 Encounters:  06/18/18 (!) 152/95  04/29/18 111/71  03/24/18 131/85   Exam: Normal Speech.  No apparent increased WOB  Lab and Radiology Results No results found for this or any previous visit (from the past 72 hour(s)). No results found.     Assessment and Plan: 48 y.o. male with The primary encounter diagnosis was Elevated blood pressure reading. A diagnosis of Shortness of breath was also pertinent to this visit.   PDMP not reviewed this encounter. Orders Placed This Encounter  Procedures  . CBC  . COMPLETE METABOLIC PANEL WITH GFR  . TSH  . B Nat Peptide   Meds ordered this encounter  Medications  . hydrochlorothiazide (HYDRODIURIL) 12.5 MG tablet    Sig: Take 1 tablet (12.5 mg total) by mouth daily.    Dispense:  30 tablet    Refill:  0   Patient Instructions  Plan:  Office visit  tomorrow to evaluate breathing, check BP and verify home BP monitor. Depending on exam, may consider EKG and/or chest Xray.   Labs today.   Prescription sent for low-dose BP medication, can start tonight or can wait until after you see me tomorrow.   If worsening breathing trouble or if chest pain, please go to ER!     Instructions sent via MyChart. If MyChart not available, pt was given option for info via personal e-mail w/ no guarantee of protected health info over unsecured e-mail communication, and MyChart sign-up instructions were included.   Follow Up Instructions: Return for office visit tomorrow arrive 8:20 .    I discussed the assessment and treatment plan with the patient. The patient was provided an opportunity to ask questions and all were answered. The patient agreed with the plan and demonstrated an understanding of the instructions.   The patient was advised to call back or seek an in-person evaluation if the symptoms worsen or if the condition fails to improve as anticipated.  I provided 25 minutes of non-face-to-face time during this encounter.                      Historical information moved to improve visibility of documentation.  Past Medical History:  Diagnosis Date  . Encounter for biometric screening 01/04/2015   No past surgical history on file. Social History   Tobacco Use  .  Smoking status: Former Smoker    Types: Cigarettes    Last attempt to quit: 06/13/2015    Years since quitting: 3.0  . Smokeless tobacco: Former Engineer, water Use Topics  . Alcohol use: Not on file   family history is not on file.  Medications: Current Outpatient Medications  Medication Sig Dispense Refill  . hydrochlorothiazide (HYDRODIURIL) 12.5 MG tablet Take 1 tablet (12.5 mg total) by mouth daily. 30 tablet 0   No current facility-administered medications for this visit.    No Known Allergies  PDMP not reviewed this encounter. Orders Placed  This Encounter  Procedures  . CBC  . COMPLETE METABOLIC PANEL WITH GFR  . TSH  . B Nat Peptide   Meds ordered this encounter  Medications  . hydrochlorothiazide (HYDRODIURIL) 12.5 MG tablet    Sig: Take 1 tablet (12.5 mg total) by mouth daily.    Dispense:  30 tablet    Refill:  0

## 2018-06-19 ENCOUNTER — Ambulatory Visit (INDEPENDENT_AMBULATORY_CARE_PROVIDER_SITE_OTHER): Payer: 59 | Admitting: Osteopathic Medicine

## 2018-06-19 ENCOUNTER — Encounter: Payer: Self-pay | Admitting: Osteopathic Medicine

## 2018-06-19 VITALS — BP 143/89 | HR 67 | Temp 98.5°F | Wt 195.9 lb

## 2018-06-19 DIAGNOSIS — I10 Essential (primary) hypertension: Secondary | ICD-10-CM | POA: Diagnosis not present

## 2018-06-19 DIAGNOSIS — F418 Other specified anxiety disorders: Secondary | ICD-10-CM | POA: Diagnosis not present

## 2018-06-19 LAB — COMPLETE METABOLIC PANEL WITH GFR
AG Ratio: 1.4 (calc) (ref 1.0–2.5)
ALT: 30 U/L (ref 9–46)
AST: 45 U/L — ABNORMAL HIGH (ref 10–40)
Albumin: 4.6 g/dL (ref 3.6–5.1)
Alkaline phosphatase (APISO): 77 U/L (ref 36–130)
BUN: 13 mg/dL (ref 7–25)
CO2: 27 mmol/L (ref 20–32)
Calcium: 9.6 mg/dL (ref 8.6–10.3)
Chloride: 102 mmol/L (ref 98–110)
Creat: 1.11 mg/dL (ref 0.60–1.35)
GFR, Est African American: 91 mL/min/{1.73_m2} (ref >=60)
GFR, Est Non African American: 78 mL/min/{1.73_m2} (ref >=60)
Globulin: 3.3 g/dL (calc) (ref 1.9–3.7)
Glucose, Bld: 88 mg/dL (ref 65–99)
Potassium: 4.8 mmol/L (ref 3.5–5.3)
Sodium: 137 mmol/L (ref 135–146)
Total Bilirubin: 1.6 mg/dL — ABNORMAL HIGH (ref 0.2–1.2)
Total Protein: 7.9 g/dL (ref 6.1–8.1)

## 2018-06-19 LAB — CBC
HCT: 45.6 % (ref 38.5–50.0)
Hemoglobin: 15.4 g/dL (ref 13.2–17.1)
MCH: 28.8 pg (ref 27.0–33.0)
MCHC: 33.8 g/dL (ref 32.0–36.0)
MCV: 85.4 fL (ref 80.0–100.0)
MPV: 9.4 fL (ref 7.5–12.5)
Platelets: 312 10*3/uL (ref 140–400)
RBC: 5.34 10*6/uL (ref 4.20–5.80)
RDW: 12.4 % (ref 11.0–15.0)
WBC: 6.7 10*3/uL (ref 3.8–10.8)

## 2018-06-19 LAB — BRAIN NATRIURETIC PEPTIDE: Brain Natriuretic Peptide: 76 pg/mL (ref <100)

## 2018-06-19 LAB — TSH: TSH: 1.85 mIU/L (ref 0.40–4.50)

## 2018-06-19 NOTE — Patient Instructions (Signed)
Plan:   Will start blood pressure medications   Constitute to check your blood pressure numbers at home  Goal is 130 top number or less, 80 bottom number or less   Will have a phone/computer visit next week to follow-up   I think the breathing is more likely related to stress  Very unlikely that it is infection, but please follow general infection prevention precautions with handwashing, don't touch your face, distance yourself from others, stay home unless needed  If breathing is worse, or if you develop fever, cough, sinus pressure, sore throat, please call me or seek emergency care if needed!

## 2018-06-19 NOTE — Progress Notes (Signed)
HPI: Alex Wyatt is a 48 y.o. male who  has a past medical history of Encounter for biometric screening (01/04/2015).  he presents to [facility] today, 06/19/18,  for chief complaint of:  HTN SOB  Feeling like he can't catch his breath, not sure if panic related. Associated w/ heart racing. Closing his eyes and taking deep breaths seems to help.    BP: home monitor 142/98, our 143/89, diastolic reads a little higher on his. We got labs yesterday. Labs as below reviewed, all questions answered. I sent Rx for HCTZ 12.5 mg to take daily. No chest pain. No dyspnea on exertion, no orthopnea, no LE edema.          At today's visit 06/19/18 ... PMH, PSH, FH reviewed and updated as needed.  Current medication list and allergy/intolerance hx reviewed and updated as needed. (See remainder of HPI, ROS, Phys Exam below)  Cr at baseline No anemia Slight bilirubin and AST elevation from previous Normal TSH No other electrolyte derangements   No results found.  Results for orders placed or performed in visit on 06/18/18 (from the past 72 hour(s))  CBC     Status: None   Collection Time: 06/18/18 11:32 AM  Result Value Ref Range   WBC 6.7 3.8 - 10.8 Thousand/uL   RBC 5.34 4.20 - 5.80 Million/uL   Hemoglobin 15.4 13.2 - 17.1 g/dL   HCT 27.0 78.6 - 75.4 %   MCV 85.4 80.0 - 100.0 fL   MCH 28.8 27.0 - 33.0 pg   MCHC 33.8 32.0 - 36.0 g/dL   RDW 49.2 01.0 - 07.1 %   Platelets 312 140 - 400 Thousand/uL   MPV 9.4 7.5 - 12.5 fL  COMPLETE METABOLIC PANEL WITH GFR     Status: Abnormal   Collection Time: 06/18/18 11:32 AM  Result Value Ref Range   Glucose, Bld 88 65 - 99 mg/dL    Comment: .            Fasting reference interval .    BUN 13 7 - 25 mg/dL   Creat 2.19 7.58 - 8.32 mg/dL   GFR, Est Non African American 78 > OR = 60 mL/min/1.34m2   GFR, Est African American 91 > OR = 60 mL/min/1.28m2   BUN/Creatinine Ratio NOT APPLICABLE 6 - 22 (calc)    Sodium 137 135 - 146 mmol/L   Potassium 4.8 3.5 - 5.3 mmol/L   Chloride 102 98 - 110 mmol/L   CO2 27 20 - 32 mmol/L   Calcium 9.6 8.6 - 10.3 mg/dL   Total Protein 7.9 6.1 - 8.1 g/dL   Albumin 4.6 3.6 - 5.1 g/dL   Globulin 3.3 1.9 - 3.7 g/dL (calc)   AG Ratio 1.4 1.0 - 2.5 (calc)   Total Bilirubin 1.6 (H) 0.2 - 1.2 mg/dL   Alkaline phosphatase (APISO) 77 36 - 130 U/L   AST 45 (H) 10 - 40 U/L   ALT 30 9 - 46 U/L  TSH     Status: None   Collection Time: 06/18/18 11:32 AM  Result Value Ref Range   TSH 1.85 0.40 - 4.50 mIU/L          ASSESSMENT/PLAN: The primary encounter diagnosis was Essential hypertension. A diagnosis of Anxiety about health was also pertinent to this visit.   Normal exam, I think anxiety component more likely, he is very worried about blood pressure    Patient Instructions  Plan:  Will start blood pressure medications   Constitute to check your blood pressure numbers at home  Goal is 130 top number or less, 80 bottom number or less   Will have a phone/computer visit next week to follow-up   I think the breathing is more likely related to stress  Very unlikely that it is infection, but please follow general infection prevention precautions with handwashing, don't touch your face, distance yourself from others, stay home unless needed  If breathing is worse, or if you develop fever, cough, sinus pressure, sore throat, please call me or seek emergency care if needed!      Follow-up plan: Return for virtual visit next Weds to recheck breathing and blood pressure .                                                 ################################################# ################################################# ################################################# #################################################    No outpatient medications have been marked as taking for the 06/19/18 encounter  (Appointment) with Sunnie NielsenAlexander, Suhan Paci, DO.    No Known Allergies     Review of Systems:  Constitutional: No recent illness  HEENT: No  headache, no vision change, no runny nose, no sore throat  Cardiac: No  chest pain, No  pressure, No palpitations  Respiratory:  +shortness of breath. No  Cough  Gastrointestinal: No  abdominal pain, no change on bowel habits  Musculoskeletal: No new myalgia/arthralgia  Skin: No  Rash  Hem/Onc: No  easy bruising/bleeding, No  abnormal lumps/bumps  Neurologic: No  weakness, No  Dizziness  Psychiatric: No  concerns with depression, No  concerns with anxiety  Exam:  BP (!) 143/89 (BP Location: Left Arm, Patient Position: Sitting, Cuff Size: Normal)   Pulse 67   Temp 98.5 F (36.9 C) (Oral)   Wt 195 lb 14.4 oz (88.9 kg)   BMI 28.11 kg/m   Constitutional: VS see above. General Appearance: alert, well-developed, well-nourished, NAD  Eyes: Normal lids and conjunctive, non-icteric sclera  Ears, Nose, Mouth, Throat: MMM, Normal external inspection ears/nares/mouth/lips/gums.  Neck: No masses, trachea midline.   Respiratory: Normal respiratory effort. no wheeze, no rhonchi, no rales  Cardiovascular: S1/S2 normal, no murmur, no rub/gallop auscultated. RRR.   Musculoskeletal: Gait normal. Symmetric and independent movement of all extremities  Neurological: Normal balance/coordination. No tremor.  Skin: warm, dry, intact.   Psychiatric: Normal judgment/insight. Normal mood and affect. Oriented x3.       Visit summary with medication list and pertinent instructions was printed for patient to review, patient was advised to alert us if any updates are needed. All questions at time of visit were answered - patient instructed to contact office with any additional concerns. ER/RTC precautions were reviewed with the patient and understanding verbalized.     Please note: voice recognition software was used to produce this document, and  typos may escape review. Please contact Dr. Lyn HollingsheadAlexander for any needed clarifications.    Follow up plan: Return for virtual visit next Weds to recheck breathing and blood pressure .

## 2018-06-25 ENCOUNTER — Encounter: Payer: Self-pay | Admitting: Osteopathic Medicine

## 2018-06-25 ENCOUNTER — Ambulatory Visit (INDEPENDENT_AMBULATORY_CARE_PROVIDER_SITE_OTHER): Payer: 59 | Admitting: Osteopathic Medicine

## 2018-06-25 VITALS — BP 128/92 | HR 73 | Wt 195.0 lb

## 2018-06-25 DIAGNOSIS — R030 Elevated blood-pressure reading, without diagnosis of hypertension: Secondary | ICD-10-CM

## 2018-06-25 NOTE — Progress Notes (Signed)
Virtual Visit  via Phone Note  I connected with      Alex Wyatt on 06/25/18 at 10:50 by a telemedicine application and verified that I am speaking with the correct person using two identifiers.   I discussed the limitations of evaluation and management by telemedicine and the availability of in person appointments. The patient expressed understanding and agreed to proceed.  History of Present Illness: Alex Wyatt is a 48 y.o. male who would like to discuss  Chief Complaint  Patient presents with  . Follow-up breathing, anxiety    Seen in office last week for c/o SOB, concern for HTN and anxiety. No concerns on labs. We decided to treat for HTN. Pt reports doing much better, his BP systolic has been consistently in 120's and diastolic usually 16'W-73'X. No CP/SOB, no HA/VC, no dizziness         Observations/Objective: BP (!) 128/92 (BP Location: Left Arm, Patient Position: Sitting, Cuff Size: Normal)   Pulse 73   Wt 195 lb (88.5 kg)   BMI 27.98 kg/m  BP Readings from Last 3 Encounters:  06/25/18 (!) 128/92  06/19/18 (!) 143/89  06/18/18 (!) 152/95   Exam: Normal Speech.    Lab and Radiology Results No results found for this or any previous visit (from the past 72 hour(s)). No results found.     Assessment and Plan: 48 y.o. male with The encounter diagnosis was Elevated blood pressure reading.   PDMP not reviewed this encounter. Orders Placed This Encounter  Procedures  . COMPLETE METABOLIC PANEL WITH GFR   No orders of the defined types were placed in this encounter.  There are no Patient Instructions on file for this visit. Instructions sent via MyChart. If MyChart not available, pt was given option for info via personal e-mail w/ no guarantee of protected health info over unsecured e-mail communication, and MyChart sign-up instructions were included.   Follow Up Instructions: Return in about 6 months (around 12/25/2018) for BP recheck,  annual physical around 04/2019, lab visit in 4-5 weeks. see me sooner if needed! .    I discussed the assessment and treatment plan with the patient. The patient was provided an opportunity to ask questions and all were answered. The patient agreed with the plan and demonstrated an understanding of the instructions.   The patient was advised to call back or seek an in-person evaluation if the symptoms worsen or if the condition fails to improve as anticipated.  Provided 15 minutes of non-face-to-face time during this encounter.                      Historical information moved to improve visibility of documentation.  Past Medical History:  Diagnosis Date  . Encounter for biometric screening 01/04/2015   History reviewed. No pertinent surgical history. Social History   Tobacco Use  . Smoking status: Former Smoker    Types: Cigarettes    Last attempt to quit: 06/13/2015    Years since quitting: 3.0  . Smokeless tobacco: Former Engineer, water Use Topics  . Alcohol use: Not on file   family history is not on file.  Medications: Current Outpatient Medications  Medication Sig Dispense Refill  . hydrochlorothiazide (HYDRODIURIL) 12.5 MG tablet Take 1 tablet (12.5 mg total) by mouth daily. 30 tablet 0   No current facility-administered medications for this visit.    No Known Allergies  PDMP not reviewed this encounter. Orders Placed This Encounter  Procedures  . COMPLETE METABOLIC PANEL WITH GFR   No orders of the defined types were placed in this encounter.

## 2018-07-12 ENCOUNTER — Other Ambulatory Visit: Payer: Self-pay | Admitting: Osteopathic Medicine

## 2018-07-12 NOTE — Telephone Encounter (Signed)
Please advise 

## 2018-07-25 ENCOUNTER — Telehealth: Payer: Self-pay | Admitting: Osteopathic Medicine

## 2018-07-25 NOTE — Telephone Encounter (Signed)
-----   Message from Sunnie Nielsen, DO sent at 06/25/2018 11:06 AM EDT ----- Regarding: labs Neds labs for Rx monitoring - please call pt to remind him, orders are in

## 2018-07-25 NOTE — Telephone Encounter (Signed)
Left message on machine for patient reminding him to have labs collected and to call the office with any questions.

## 2018-08-01 ENCOUNTER — Encounter: Payer: Self-pay | Admitting: Osteopathic Medicine

## 2018-08-01 ENCOUNTER — Ambulatory Visit (INDEPENDENT_AMBULATORY_CARE_PROVIDER_SITE_OTHER): Payer: 59 | Admitting: Osteopathic Medicine

## 2018-08-01 VITALS — BP 117/72 | HR 66 | Temp 98.5°F | Wt 194.4 lb

## 2018-08-01 DIAGNOSIS — R071 Chest pain on breathing: Secondary | ICD-10-CM | POA: Diagnosis not present

## 2018-08-01 DIAGNOSIS — R0989 Other specified symptoms and signs involving the circulatory and respiratory systems: Secondary | ICD-10-CM | POA: Diagnosis not present

## 2018-08-01 LAB — COMPLETE METABOLIC PANEL WITH GFR
AG Ratio: 1.5 (calc) (ref 1.0–2.5)
ALT: 13 U/L (ref 9–46)
AST: 20 U/L (ref 10–40)
Albumin: 4.4 g/dL (ref 3.6–5.1)
Alkaline phosphatase (APISO): 69 U/L (ref 36–130)
BUN: 18 mg/dL (ref 7–25)
CO2: 27 mmol/L (ref 20–32)
Calcium: 9.7 mg/dL (ref 8.6–10.3)
Chloride: 102 mmol/L (ref 98–110)
Creat: 1.18 mg/dL (ref 0.60–1.35)
GFR, Est African American: 84 mL/min/{1.73_m2} (ref >=60)
GFR, Est Non African American: 73 mL/min/{1.73_m2} (ref >=60)
Globulin: 2.9 g/dL (calc) (ref 1.9–3.7)
Glucose, Bld: 86 mg/dL (ref 65–139)
Potassium: 4.4 mmol/L (ref 3.5–5.3)
Sodium: 139 mmol/L (ref 135–146)
Total Bilirubin: 0.7 mg/dL (ref 0.2–1.2)
Total Protein: 7.3 g/dL (ref 6.1–8.1)

## 2018-08-01 MED ORDER — OMEPRAZOLE 40 MG PO CPDR: 40.0000 mg | DELAYED_RELEASE_CAPSULE | Freq: Every day | ORAL | 1 refills | Status: DC

## 2018-08-01 NOTE — Progress Notes (Signed)
HPI: Alex Wyatt is a 48 y.o. male who  has a past medical history of Encounter for biometric screening (01/04/2015).  he presents to [facility] today, 08/01/18,  for chief complaint of:  Chest pain   . Pain for a few seconds last night, in R chest, radiating into the back, this happened briefly last night when he took a deep breath, but resolved spontaneously.  Marland Kitchen also concern for feeling like something is stuck in his throat. This has been going on for months.        At today's visit 08/01/18 ... PMH, PSH, FH reviewed and updated as needed.  Current medication list and allergy/intolerance hx reviewed and updated as needed. (See remainder of HPI, ROS, Phys Exam below)   No results found.  Results for orders placed or performed in visit on 06/25/18 (from the past 72 hour(s))  COMPLETE METABOLIC PANEL WITH GFR     Status: None   Collection Time: 07/31/18 11:21 AM  Result Value Ref Range   Glucose, Bld 86 65 - 139 mg/dL    Comment: .        Non-fasting reference interval .    BUN 18 7 - 25 mg/dL   Creat 3.90 3.00 - 9.23 mg/dL   GFR, Est Non African American 73 > OR = 60 mL/min/1.77m2   GFR, Est African American 84 > OR = 60 mL/min/1.47m2   BUN/Creatinine Ratio NOT APPLICABLE 6 - 22 (calc)   Sodium 139 135 - 146 mmol/L   Potassium 4.4 3.5 - 5.3 mmol/L   Chloride 102 98 - 110 mmol/L   CO2 27 20 - 32 mmol/L   Calcium 9.7 8.6 - 10.3 mg/dL   Total Protein 7.3 6.1 - 8.1 g/dL   Albumin 4.4 3.6 - 5.1 g/dL   Globulin 2.9 1.9 - 3.7 g/dL (calc)   AG Ratio 1.5 1.0 - 2.5 (calc)   Total Bilirubin 0.7 0.2 - 1.2 mg/dL   Alkaline phosphatase (APISO) 69 36 - 130 U/L   AST 20 10 - 40 U/L   ALT 13 9 - 46 U/L    EKG interpretation: Rate: 57 Rhythm: sinus No ST/T changes concerning for acute ischemia/infarct  Previous EKG no tracings on file       ASSESSMENT/PLAN: The primary encounter diagnosis was Chest pain on breathing. A diagnosis  of Globus sensation was also pertinent to this visit.   Brief episode of pleuritic type chest pain, spontaneously resolved.  Seems very unlikely to be anything cardiovascular, would be reasonable for a CT chest to rule out dissection but patient would like to hold off on any testing for that at this point.  I advised on ER precautions   Meds ordered this encounter  Medications  . omeprazole (PRILOSEC) 40 MG capsule    Sig: Take 1 capsule (40 mg total) by mouth daily.    Dispense:  30 capsule    Refill:  1    Patient Instructions  I do not think that the pain in your chest/back was anything to worry about.  I think it was most likely a muscle spasm.  However, for complete evaluation, we would need to do a CT scan of the chest to rule out anything dangerous.  You and I have decided you will monitor your symptoms and we will not get a CT scan today, however if you do experience similar chest pain that doesn't go away, especially if trouble breathing, please seek emergency medical  attention/call 911.   I have sent an antacid medication which may help the sensation in your throat.  If this is not helping after 2 weeks, please let us know and we will send a referral to a GI specialist to take a closer look.         Follow-up plan: Return if symptoms worsen or fail to improve. .                                                 ################################################# ################################################# ################################################# #################################################    Current Meds  Medication Sig  . hydrochlorothiazide (HYDRODIURIL) 12.5 MG tablet TAKE 1 TABLET BY MOUTH EVERY DAY    No Known Allergies     Review of Systems:  Constitutional: No recent illness  HEENT: No  headache, no vision change  Cardiac: +chest pain as per HPI, No  pressure, No  palpitations  Respiratory:  No  shortness of breath. No  Cough  Gastrointestinal: No  abdominal pain, no change on bowel habits  Musculoskeletal: No new myalgia/arthralgia  Psychiatric: No  concerns with depression, No  concerns with anxiety  Exam:  BP 117/72 (BP Location: Left Arm, Patient Position: Sitting, Cuff Size: Normal)   Pulse 66   Temp 98.5 F (36.9 C) (Oral)   Wt 194 lb 6.4 oz (88.2 kg)   BMI 27.89 kg/m   Constitutional: VS see above. General Appearance: alert, well-developed, well-nourished, NAD  Eyes: Normal lids and conjunctive, non-icteric sclera  Ears, Nose, Mouth, Throat: MMM, Normal external inspection ears/nares/mouth/lips/gums.  Neck: No masses, trachea midline.   Respiratory: Normal respiratory effort. no wheeze, no rhonchi, no rales  Cardiovascular: S1/S2 normal, no murmur, no rub/gallop auscultated. RRR.   Musculoskeletal: Gait normal. Symmetric and independent movement of all extremities  Neurological: Normal balance/coordination. No tremor.  Skin: warm, dry, intact.   Psychiatric: Normal judgment/insight. Normal mood and affect. Oriented x3.       Visit summary with medication list and pertinent instructions was printed for patient to review, patient was advised to alert us if any updates are needed. All questions at time of visit were answered - patient instructed to contact office with any additional concerns. ER/RTC precautions were reviewed with the patient and understanding verbalized.     Please note: voice recognition software was used to produce this document, and typos may escape review. Please contact Dr. Lyn HollingsheadAlexander for any needed clarifications.    Follow up plan: Return if symptoms worsen or fail to improve. .Marland Kitchen

## 2018-08-01 NOTE — Patient Instructions (Addendum)
I do not think that the pain in your chest/back was anything to worry about.  I think it was most likely a muscle spasm.  However, for complete evaluation, we would need to do a CT scan of the chest to rule out anything dangerous.  You and I have decided you will monitor your symptoms and we will not get a CT scan today, however if you do experience similar chest pain that doesn't go away, especially if trouble breathing, please seek emergency medical attention/call 911.   I have sent an antacid medication which may help the sensation in your throat.  If this is not helping after 2 weeks, please let us know and we will send a referral to a GI specialist to take a closer look.

## 2018-08-21 ENCOUNTER — Telehealth: Payer: Self-pay | Admitting: Osteopathic Medicine

## 2018-08-21 NOTE — Telephone Encounter (Addendum)
We started on omeprazole couple weeks ago, we discussed that if symptoms have not resolved, will send to GI. Can we call and see if he is still having choking/lump in throat sensation?

## 2018-08-21 NOTE — Telephone Encounter (Signed)
Left a detailed vm msg for pt regarding provider's note. Aware to return call back with an update. Direct call back info provided.

## 2018-09-12 NOTE — Addendum Note (Signed)
Addended by: Clemetine Marker A on: 09/12/2018 08:49 AM   Modules accepted: Orders

## 2019-01-05 ENCOUNTER — Encounter: Payer: Self-pay | Admitting: Osteopathic Medicine

## 2019-01-05 ENCOUNTER — Ambulatory Visit (INDEPENDENT_AMBULATORY_CARE_PROVIDER_SITE_OTHER): Payer: 59 | Admitting: Osteopathic Medicine

## 2019-01-05 DIAGNOSIS — R0989 Other specified symptoms and signs involving the circulatory and respiratory systems: Secondary | ICD-10-CM | POA: Diagnosis not present

## 2019-01-05 DIAGNOSIS — R1310 Dysphagia, unspecified: Secondary | ICD-10-CM

## 2019-01-05 NOTE — Progress Notes (Signed)
Virtual Visit via Video (App used: Doxim[employer]) Note  I connected with      Mitchael Luckey on 01/05/19 at 1050 by a telemedicine application and verified that I am speaking with the correct person using two identifiers.  Patient is in the car  I am in the office    I discussed the limitations of evaluation and management by telemedicine and the availability of in person appointments. The patient expressed understanding and agreed to proceed.  History of Present Illness: Alex Wyatt is a 48 y.o. male who would like to discuss a sore throat  Patient has had a months long sensation of having something stuck in his throat. He feels that his throat may be somewhat swollen. Not painful, just uncomfortable. Was started on Prilosec 40 mg in May which he continues to take because it seems to help. Also takes an unknown gas medication at night but the sensation is not completely relieved by either medication. Not worsened by eating. No burning sensation in throat, trouble swallowing, nausea, vomiting or regurgitation, blood in stool, chest pain, abdominal pain, SOB, cough.    Observations/Objective: There were no vitals taken for this visit. BP Readings from Last 3 Encounters:  08/01/18 117/72  06/25/18 (!) 128/92  06/19/18 (!) 143/89   Exam: Normal Speech.   Lab and Radiology Results No results found for this or any previous visit (from the past 72 hour(s)). No results found.     Assessment and Plan: 48 y.o. male with The primary encounter diagnosis was Globus sensation. A diagnosis of Dysphagia, unspecified type was also pertinent to this visit.  Patient is having a vague globus sensation not accompanied by GI symptoms. Will plan to refer to Otolaryngology for possibly laryngoscopy. Will consider referral to GI following ENT evaluation. Patient can continue prilosec for now.   PDMP not reviewed this encounter. Orders Placed This Encounter  Procedures  . Ambulatory referral  to ENT    Referral Priority:   Routine    Referral Type:   Consultation    Referral Reason:   Specialty Services Required    Requested Specialty:   Otolaryngology    Number of Visits Requested:   1   No orders of the defined types were placed in this encounter.  There are no Patient Instructions on file for this visit.     Follow Up Instructions: Return if symptoms worsen or fail to improve.    I discussed the assessment and treatment plan with the patient. The patient was provided an opportunity to ask questions and all were answered. The patient agreed with the plan and demonstrated an understanding of the instructions.   The patient was advised to call back or seek an in-person evaluation if any new concerns, if symptoms worsen or if the condition fails to improve as anticipated.  15 minutes of non-face-to-face time was provided during this encounter.                      Historical information moved to improve visibility of documentation.  Past Medical History:  Diagnosis Date  . Encounter for biometric screening 01/04/2015   No past surgical history on file. Social History   Tobacco Use  . Smoking status: Former Smoker    Types: Cigarettes    Quit date: 06/13/2015    Years since quitting: 3.5  . Smokeless tobacco: Former Engineer, water Use Topics  . Alcohol use: Not on file   family history is not  on file.  Medications: Current Outpatient Medications  Medication Sig Dispense Refill  . hydrochlorothiazide (HYDRODIURIL) 12.5 MG tablet TAKE 1 TABLET BY MOUTH EVERY DAY 90 tablet 1  . omeprazole (PRILOSEC) 40 MG capsule Take 1 capsule (40 mg total) by mouth daily. 30 capsule 1   No current facility-administered medications for this visit.    No Known Allergies  PDMP not reviewed this encounter. No orders of the defined types were placed in this encounter.  No orders of the defined types were placed in this encounter.

## 2019-01-05 NOTE — Progress Notes (Signed)
Attempted to contact pt at 1015 am, no answer. Left a detailed vm msg in spanish.

## 2019-01-23 IMAGING — DX DG ANKLE COMPLETE 3+V*L*
3 series · 3 of 3 positions shown · non-contrast
Comparison: None.

CLINICAL DATA: Persistent pain since a twisting injury 2 months
ago.

EXAM:
LEFT ANKLE COMPLETE - 3+ VIEW

[ankle ap]
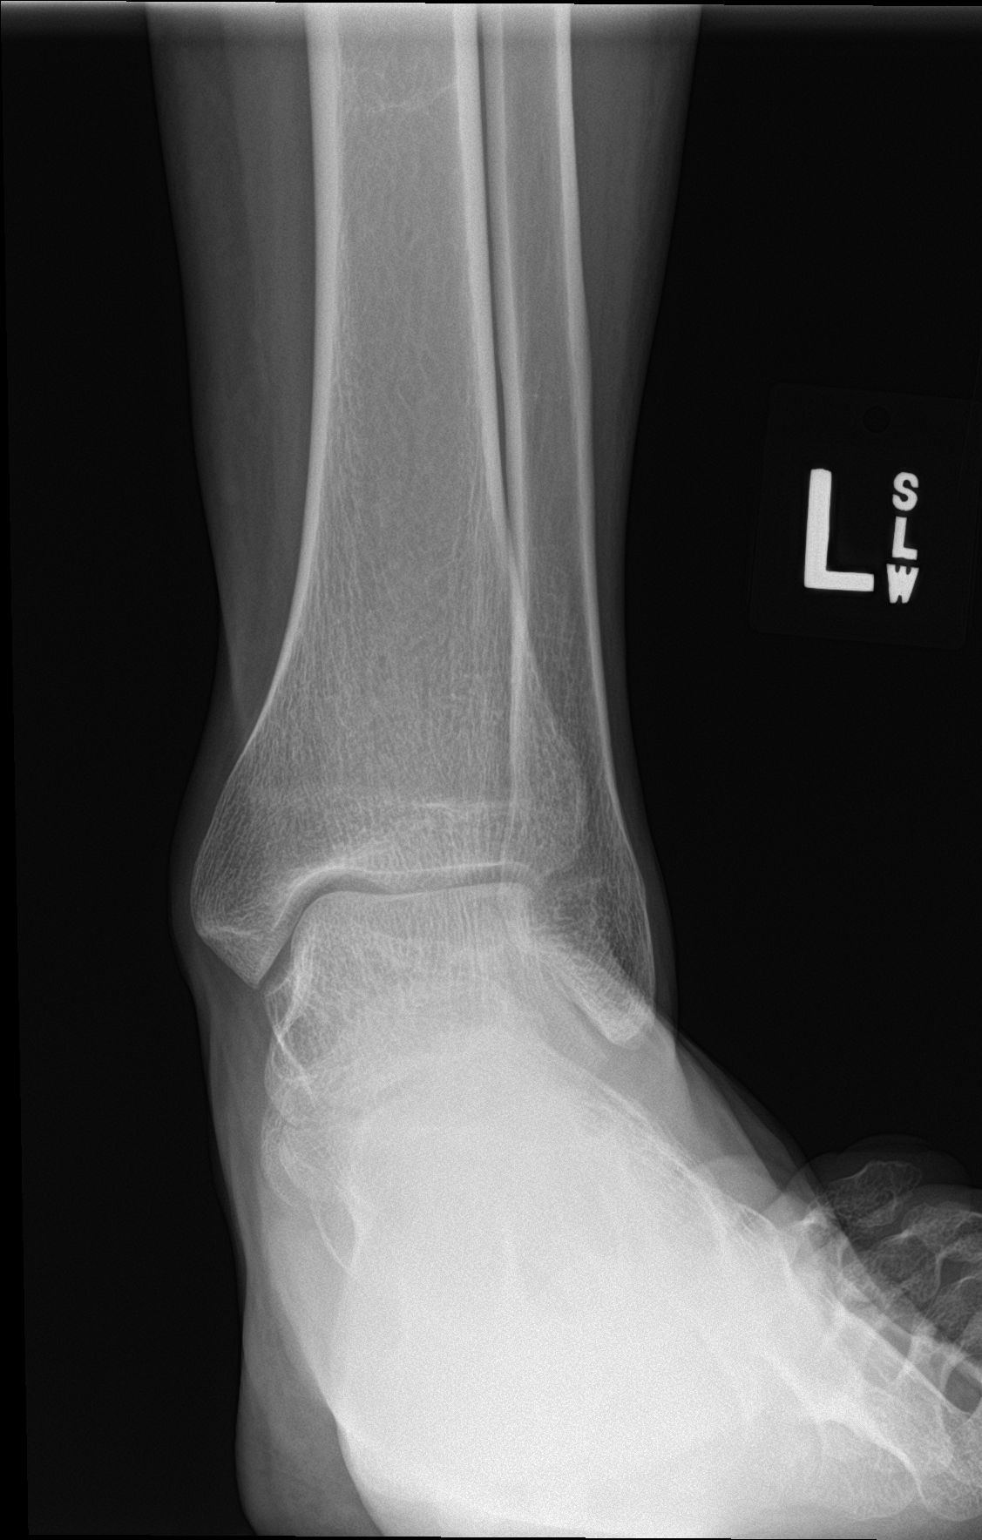

[ankle obl]
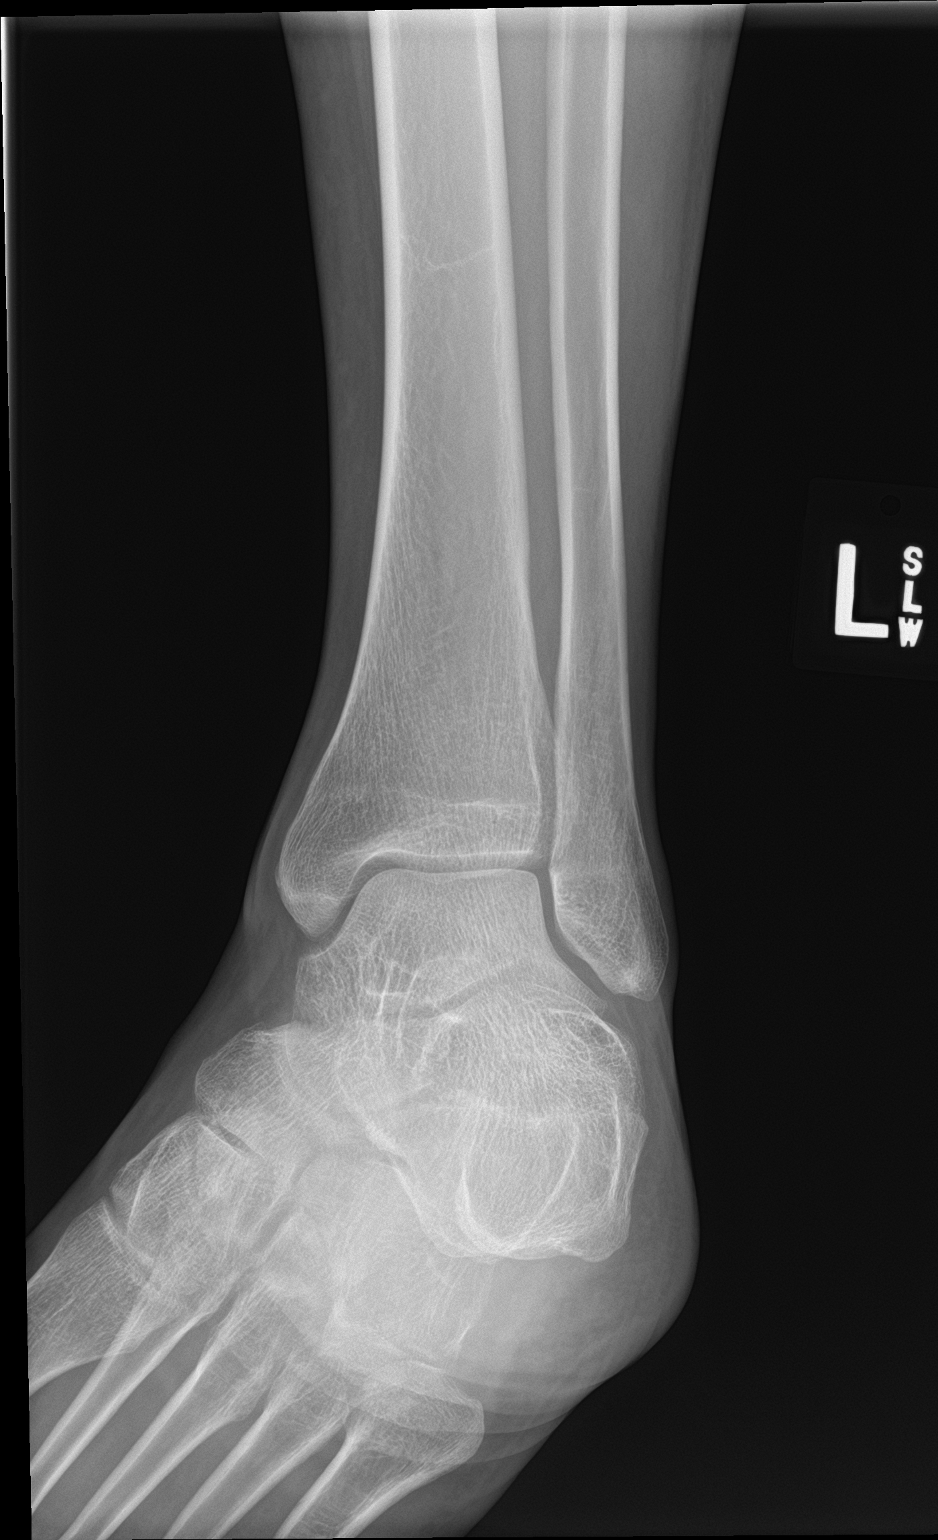

[ankle lat]
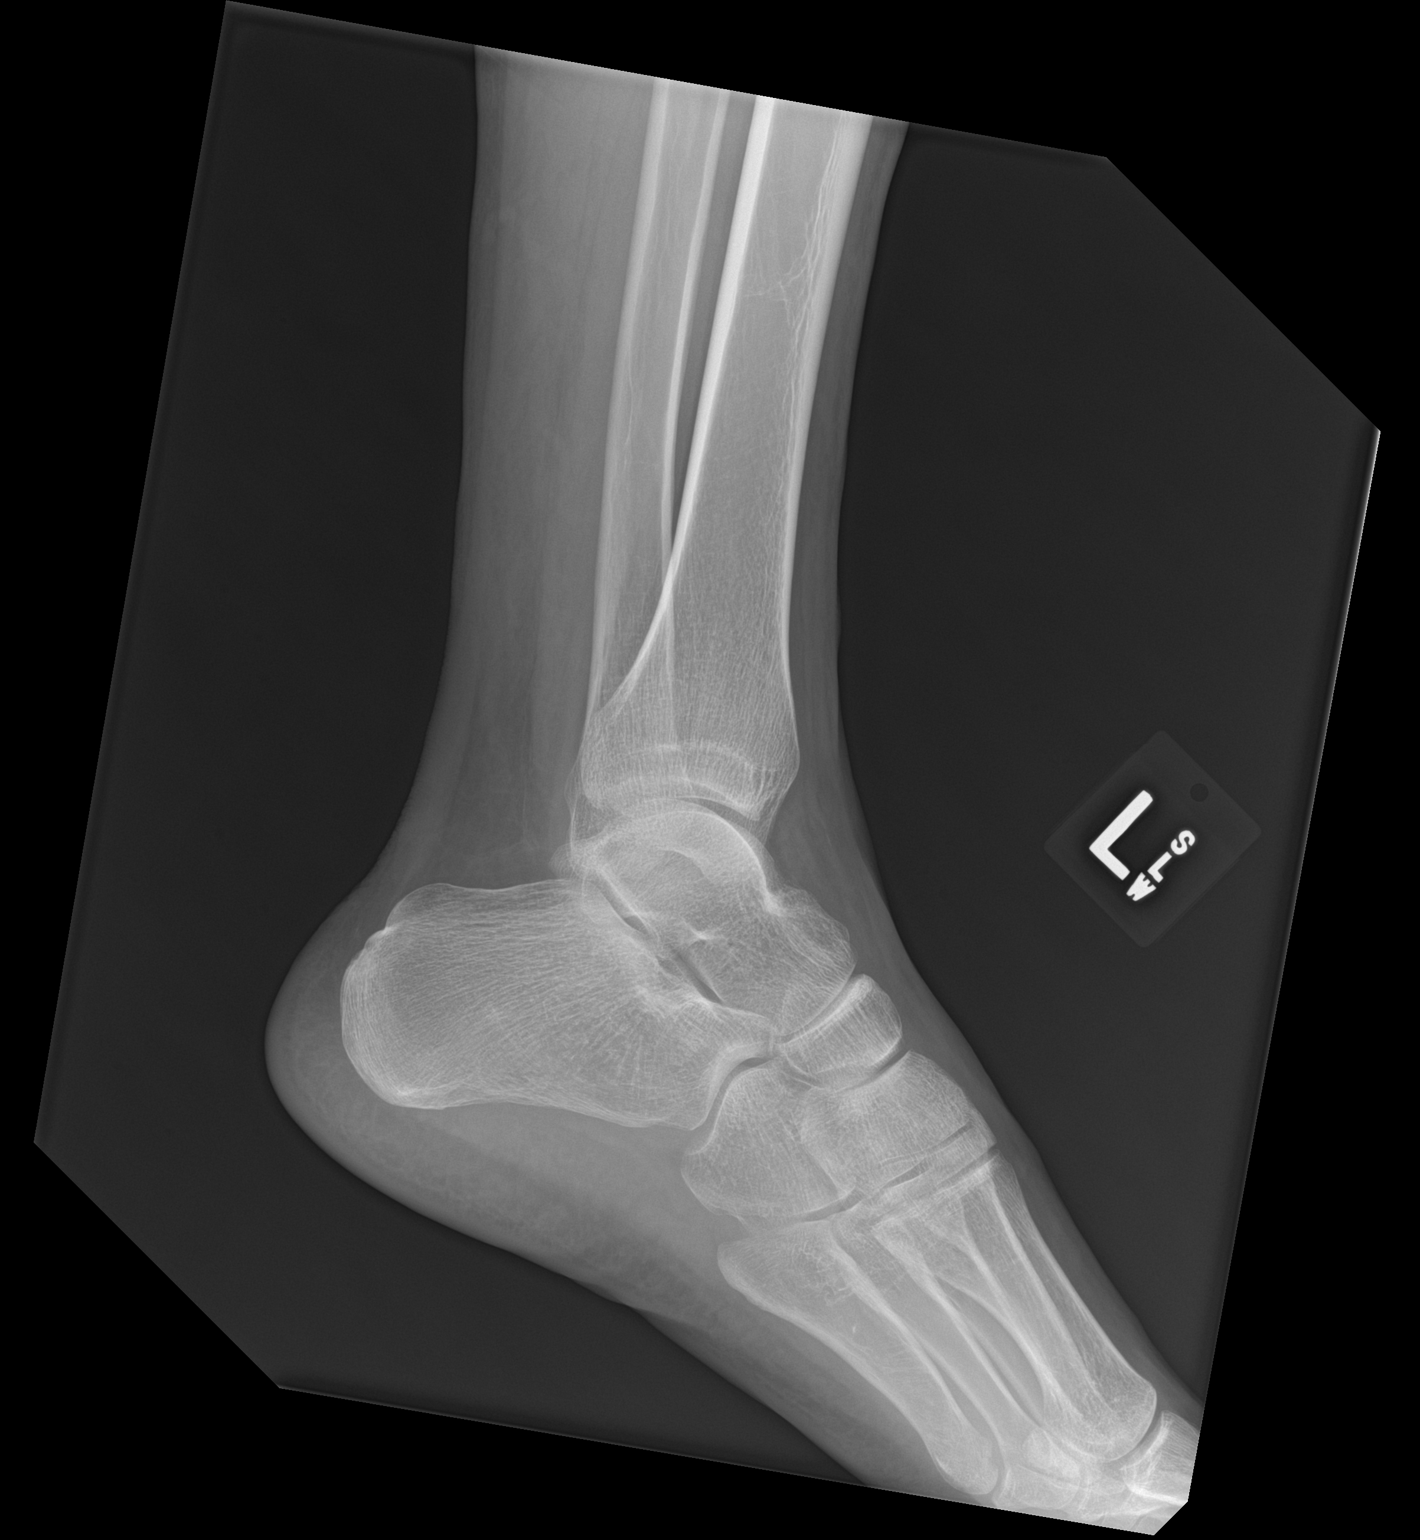

[3 of 3 positions shown; findings below may reference images not displayed]

FINDINGS: There is no evidence of fracture, dislocation, or joint effusion.
There is no evidence of arthropathy or other focal bone abnormality.
Soft tissues are unremarkable.
IMPRESSION: Negative.

## 2019-01-23 IMAGING — DX DG FOOT COMPLETE 3+V*L*
3 series · 3 of 3 positions shown · non-contrast
Comparison: None.

CLINICAL DATA: Dorsal left foot pain over the talus status post
twisting injury 2 months ago.

EXAM:
LEFT FOOT - COMPLETE 3+ VIEW

[foot ap]
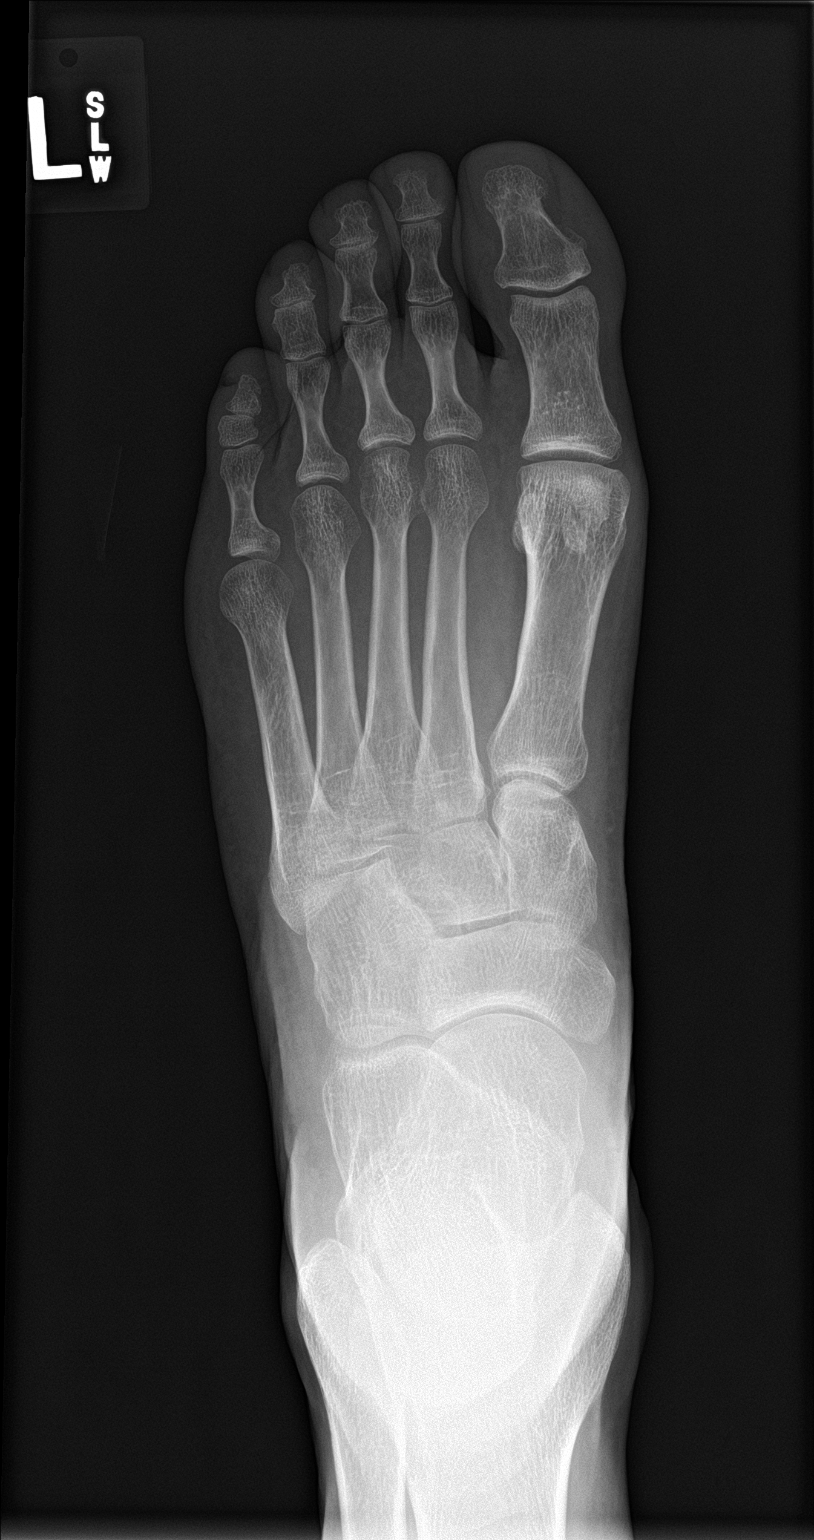

[foot obl]
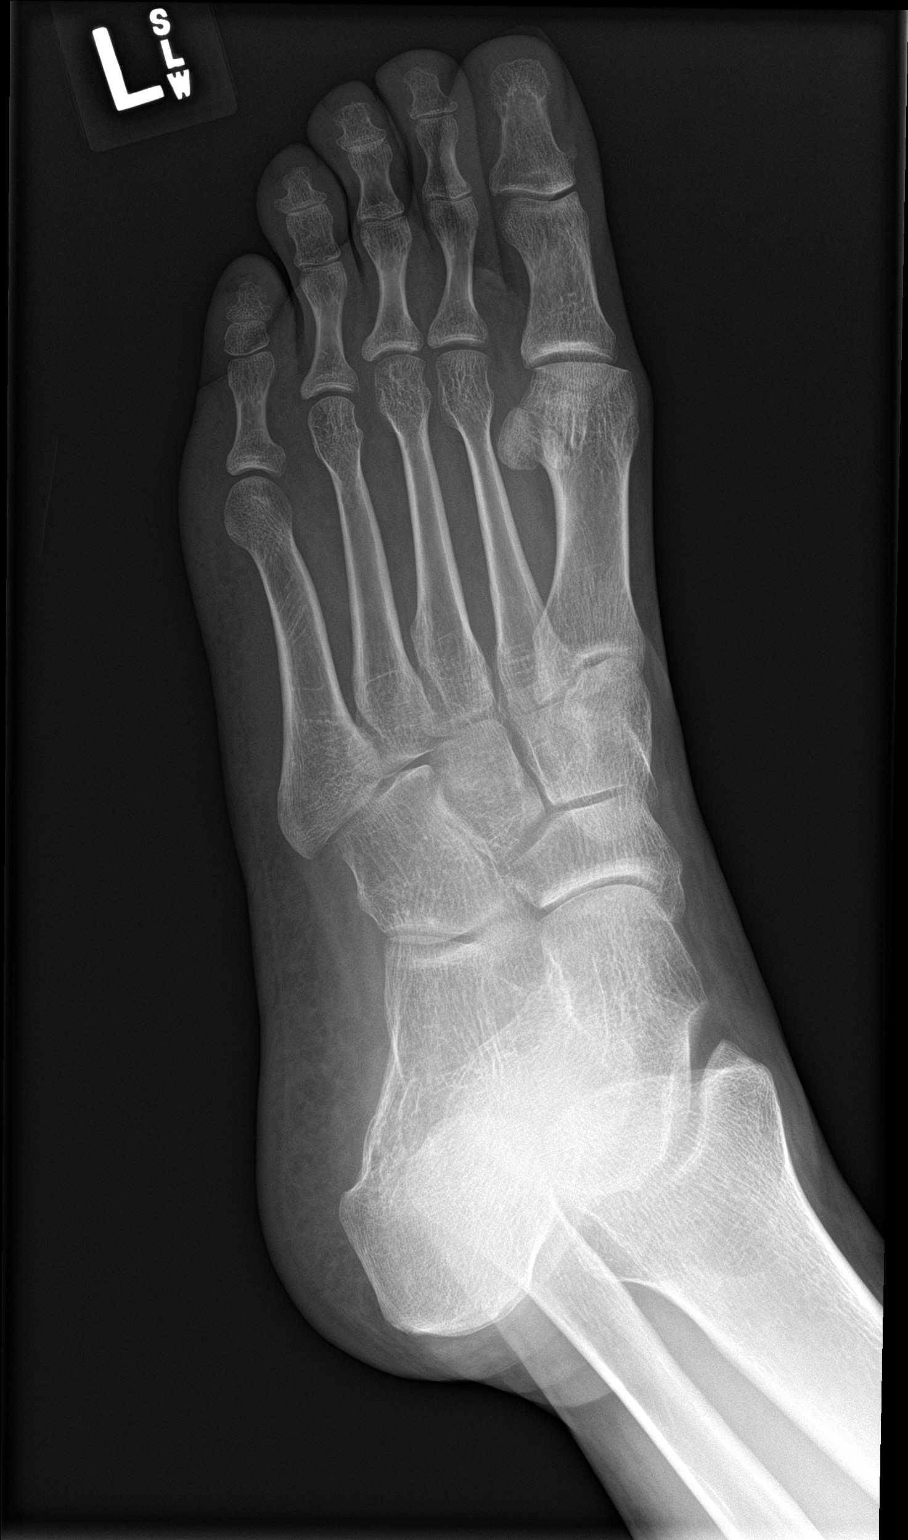

[foot lat]
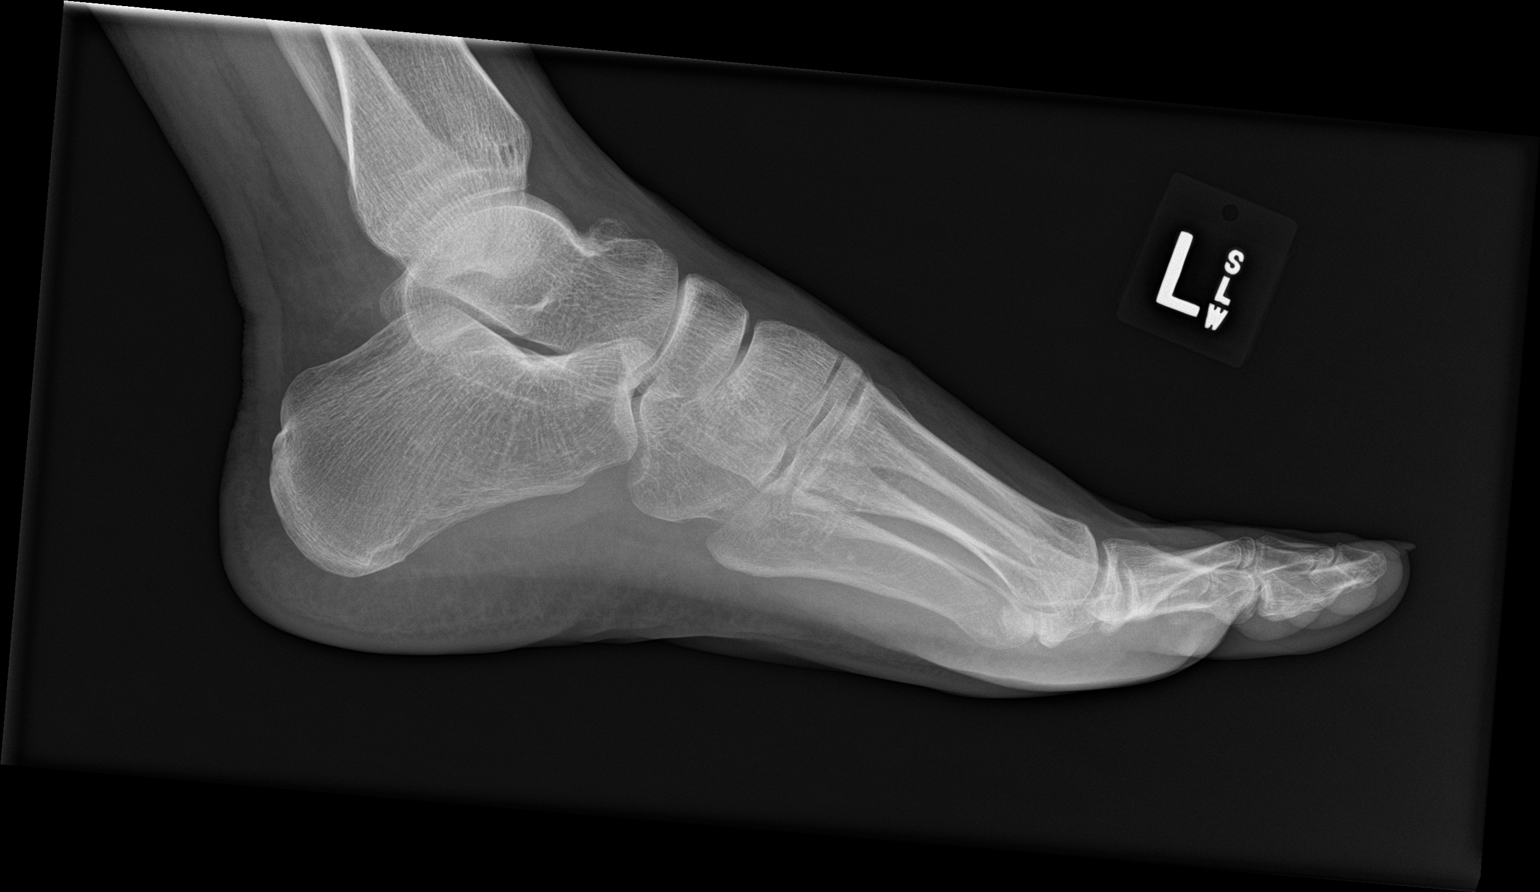

[3 of 3 positions shown; findings below may reference images not displayed]

FINDINGS: No acute fracture or dislocation. Slight irregularity and spurring
of the dorsal talus at the head/neck junction. Joint spaces are
preserved. Diffuse osteopenia, likely disuse. Soft tissues are
unremarkable.
IMPRESSION: 1.  No acute osseous abnormality.
2. Irregularity and spurring of the dorsal talus could reflect old
healed avulsion fracture and/or hypertrophy at the anterior ankle
capsule insertion.

## 2019-08-04 ENCOUNTER — Other Ambulatory Visit: Payer: Self-pay

## 2019-08-04 ENCOUNTER — Encounter: Payer: Self-pay | Admitting: Osteopathic Medicine

## 2019-08-04 ENCOUNTER — Ambulatory Visit (INDEPENDENT_AMBULATORY_CARE_PROVIDER_SITE_OTHER): Payer: 59 | Admitting: Osteopathic Medicine

## 2019-08-04 VITALS — BP 124/85 | HR 68 | Temp 98.0°F | Ht 70.0 in | Wt 200.0 lb

## 2019-08-04 DIAGNOSIS — Z0000 Encounter for general adult medical examination without abnormal findings: Secondary | ICD-10-CM

## 2019-08-04 LAB — CBC
HCT: 44.9 % (ref 38.5–50.0)
Hemoglobin: 14.9 g/dL (ref 13.2–17.1)
MCH: 28.1 pg (ref 27.0–33.0)
MCHC: 33.2 g/dL (ref 32.0–36.0)
MCV: 84.7 fL (ref 80.0–100.0)
MPV: 9.6 fL (ref 7.5–12.5)
Platelets: 275 10*3/uL (ref 140–400)
RBC: 5.3 10*6/uL (ref 4.20–5.80)
RDW: 12.8 % (ref 11.0–15.0)
WBC: 7 10*3/uL (ref 3.8–10.8)

## 2019-08-04 LAB — COMPLETE METABOLIC PANEL WITH GFR
AG Ratio: 1.4 (calc) (ref 1.0–2.5)
ALT: 16 U/L (ref 9–46)
AST: 19 U/L (ref 10–40)
Albumin: 4.2 g/dL (ref 3.6–5.1)
Alkaline phosphatase (APISO): 72 U/L (ref 36–130)
BUN: 11 mg/dL (ref 7–25)
CO2: 30 mmol/L (ref 20–32)
Calcium: 9.5 mg/dL (ref 8.6–10.3)
Chloride: 104 mmol/L (ref 98–110)
Creat: 0.97 mg/dL (ref 0.60–1.35)
GFR, Est African American: 106 mL/min/{1.73_m2} (ref >=60)
GFR, Est Non African American: 91 mL/min/{1.73_m2} (ref >=60)
Globulin: 3.1 g/dL (calc) (ref 1.9–3.7)
Glucose, Bld: 99 mg/dL (ref 65–99)
Potassium: 4.7 mmol/L (ref 3.5–5.3)
Sodium: 137 mmol/L (ref 135–146)
Total Bilirubin: 0.9 mg/dL (ref 0.2–1.2)
Total Protein: 7.3 g/dL (ref 6.1–8.1)

## 2019-08-04 LAB — LIPID PANEL
Cholesterol: 187 mg/dL (ref <200)
HDL: 38 mg/dL — ABNORMAL LOW (ref >=40)
LDL Cholesterol (Calc): 121 mg/dL (calc) — ABNORMAL HIGH
Non-HDL Cholesterol (Calc): 149 mg/dL (calc) — ABNORMAL HIGH (ref <130)
Total CHOL/HDL Ratio: 4.9 (calc) (ref <5.0)
Triglycerides: 168 mg/dL — ABNORMAL HIGH (ref <150)

## 2019-08-04 NOTE — Patient Instructions (Signed)
General Preventive Care  Most recent routine screening lipids/other labs: ordered today.   Everyone should have blood pressure checked once per year.   Tobacco: don't!  Alcohol: responsible moderation is ok for most adults - if you have concerns about your alcohol intake, please talk to me!   Exercise: as tolerated to reduce risk of cardiovascular disease and diabetes. Strength training will also prevent osteoporosis.   Mental health: if need for mental health care (medicines, counseling, other), or concerns about moods, please let me know!  Sexual health: if need for STD testing, or if concerns with libido/pain problems, please let me know!   Advanced Directive: Living Will and/or Healthcare Power of Attorney recommended for all adults, regardless of age or health.  Vaccines  Flu vaccine: recommended for almost everyone, every fall.   Shingles vaccine: Shingrix recommended after age 93.   Pneumonia vaccines: recommended after age 90  Tetanus booster: Tdap recommended every 10 years. Done 01/2015, due 2026.  COVID vaccine: THANKS for taking your vaccine! :)  Cancer screenings   Colon cancer screening: recommended for everyone age 35-75  Prostate cancer screening: PSA blood test age 58-71  Lung cancer screening: not needed for non-smokers  Infection screenings  HIV & Gonorrhea/Chlamydia: screening as needed.   Hepatitis C: recommended for anyone born 23-1966  TB: certain at-risk populations, or depending on work requirements and/or travel history Other  Bone Density Test: recommended at age 26  Abdominal Aortic Aneurysm: screening with ultrasound recommended once for men age 101-75 who have EVER smoked

## 2019-08-04 NOTE — Progress Notes (Signed)
HPI: Alex Wyatt is a 49 y.o. male who  has a past medical history of Encounter for biometric screening (01/04/2015).  he presents to [facility] today, 08/04/19,  for chief complaint of: Annual physical, biometric screening    Patient here for annual physical / wellness exam.  See preventive care reviewed as below.    Additional concerns today include: none     Past medical, surgical, social and family history reviewed:  Patient Active Problem List   Diagnosis Date Noted  . Elevated blood pressure reading 06/18/2018  . Shortness of breath 06/18/2018  . Left foot pain 03/06/2018  . Encounter for biometric screening 01/04/2015    No past surgical history on file.  Social History   Tobacco Use  . Smoking status: Former Smoker    Types: Cigarettes    Quit date: 06/13/2015    Years since quitting: 4.1  . Smokeless tobacco: Former Engineer, water Use Topics  . Alcohol use: Not on file    No family history on file.   Current medication list and allergy/intolerance information reviewed:    No current outpatient medications on file.   No current facility-administered medications for this visit.    No Known Allergies  Exam:  BP 124/85 (BP Location: Right Arm, Patient Position: Sitting)   Pulse 68   Temp 98 F (36.7 C)   Ht 5\' 10"  (1.778 m)   Wt 200 lb (90.7 kg)   SpO2 98%   BMI 28.70 kg/m   Constitutional: VS see above. General Appearance: alert, well-developed, well-nourished, NAD  Eyes: Normal lids and conjunctive, non-icteric sclera  Ears, Nose, Mouth, Throat:TM normal bilaterally.   Neck: No masses, trachea midline. No thyroid enlargement. No tenderness/mass appreciated. No lymphadenopathy  Respiratory: Normal respiratory effort. no wheeze, no rhonchi, no rales  Cardiovascular: S1/S2 normal, no murmur, no rub/gallop auscultated. RRR. No lower extremity edema.  Gastrointestinal: Nontender, no masses. No  hepatomegaly, no splenomegaly. No hernia appreciated. Bowel sounds normal. Rectal exam deferred.   Musculoskeletal: Gait normal. No clubbing/cyanosis of digits.   Neurological: Normal balance/coordination. No tremor. No cranial nerve deficit on limited exam. Motor and sensation intact and symmetric. Cerebellar reflexes intact.   Skin: warm, dry, intact. No rash/ulcer. No concerning nevi or subq nodules on limited exam.    Psychiatric: Normal judgment/insight. Normal mood and affect. Oriented x3.        ASSESSMENT/PLAN: The encounter diagnosis was Annual physical exam.  Labs today Will get back to re: colon CA screening   Orders Placed This Encounter  Procedures  . CBC  . COMPLETE METABOLIC PANEL WITH GFR  . Lipid panel    No orders of the defined types were placed in this encounter.   Patient Instructions  General Preventive Care  Most recent routine screening lipids/other labs: ordered today.   Everyone should have blood pressure checked once per year.   Tobacco: don't!  Alcohol: responsible moderation is ok for most adults - if you have concerns about your alcohol intake, please talk to me!   Exercise: as tolerated to reduce risk of cardiovascular disease and diabetes. Strength training will also prevent osteoporosis.   Mental health: if need for mental health care (medicines, counseling, other), or concerns about moods, please let me know!  Sexual health: if need for STD testing, or if concerns with libido/pain problems, please let me know!   Advanced Directive: Living Will and/or Healthcare Power of Attorney recommended for all adults, regardless of  age or health.  Vaccines  Flu vaccine: recommended for almost everyone, every fall.   Shingles vaccine: Shingrix recommended after age 35.   Pneumonia vaccines: recommended after age 26  Tetanus booster: Tdap recommended every 10 years. Done 01/2015, due 2026.  COVID vaccine: THANKS for taking your vaccine!  :)  Cancer screenings   Colon cancer screening: recommended for everyone age 52-75  Prostate cancer screening: PSA blood test age 70-71  Lung cancer screening: not needed for non-smokers  Infection screenings  HIV & Gonorrhea/Chlamydia: screening as needed.   Hepatitis C: recommended for anyone born [birth year]-76[birth year]  TB: certain at-risk populations, or depending on work requirements and/or travel history Other  Bone Density Test: recommended at age 11  Abdominal Aortic Aneurysm: screening with ultrasound recommended once for men age 53-75 who have EVER smoked      Visit summary with medication list and pertinent instructions was printed for patient to review. All questions at time of visit were answered - patient instructed to contact office with any additional concerns or updates. ER/RTC precautions were reviewed with the patient.     Please note: voice recognition software was used to produce this document, and typos may escape review. Please contact Dr. Sheppard Coil for any needed clarifications.     Follow-up plan: Return in about 1 year (around 08/03/2020) for [place] (call week prior to visit for lab orders).

## 2022-02-13 ENCOUNTER — Encounter: Payer: Self-pay | Admitting: Family Medicine

## 2022-02-13 DIAGNOSIS — I10 Essential (primary) hypertension: Secondary | ICD-10-CM | POA: Insufficient documentation

## 2022-02-14 ENCOUNTER — Ambulatory Visit: Payer: Managed Care, Other (non HMO) | Admitting: Family Medicine

## 2022-02-14 ENCOUNTER — Encounter: Payer: Self-pay | Admitting: Family Medicine

## 2022-02-14 VITALS — BP 134/92 | HR 77 | Temp 98.2°F | Ht 70.0 in | Wt 212.6 lb

## 2022-02-14 DIAGNOSIS — Z1159 Encounter for screening for other viral diseases: Secondary | ICD-10-CM | POA: Diagnosis not present

## 2022-02-14 DIAGNOSIS — Z13220 Encounter for screening for lipoid disorders: Secondary | ICD-10-CM

## 2022-02-14 DIAGNOSIS — Z0000 Encounter for general adult medical examination without abnormal findings: Secondary | ICD-10-CM | POA: Diagnosis not present

## 2022-02-14 DIAGNOSIS — Z125 Encounter for screening for malignant neoplasm of prostate: Secondary | ICD-10-CM

## 2022-02-14 DIAGNOSIS — Z136 Encounter for screening for cardiovascular disorders: Secondary | ICD-10-CM

## 2022-02-14 DIAGNOSIS — K219 Gastro-esophageal reflux disease without esophagitis: Secondary | ICD-10-CM | POA: Diagnosis not present

## 2022-02-14 DIAGNOSIS — Z23 Encounter for immunization: Secondary | ICD-10-CM

## 2022-02-14 DIAGNOSIS — I10 Essential (primary) hypertension: Secondary | ICD-10-CM

## 2022-02-14 DIAGNOSIS — R7302 Impaired glucose tolerance (oral): Secondary | ICD-10-CM

## 2022-02-14 DIAGNOSIS — Z7689 Persons encountering health services in other specified circumstances: Secondary | ICD-10-CM

## 2022-02-14 NOTE — Progress Notes (Signed)
New Patient Office Visit  Subjective    Patient ID: Alex Wyatt, male    DOB: February 18, 1971  Age: 51 y.o. MRN: [id number]  CC:  Chief Complaint  Patient presents with   New Patient (Initial Visit)    Patient in office to esst PCP - requesting annual exam today .    HPI Alex Wyatt presents to establish care and annual exam.   He is a Naval architect. He reports he was home for 1 week last year. He travels to all [place].  He had coffee and ham and cheese sandwich. He has 52 y.o, 52 y.o 2 boys. He has 52 y.o and 1 y.o grandchildren. Only taking occasional Nexium prn for GERD. He quit smoking 5 years ago. He would like flu vaccine. He would like to defer colonoscopy.    Outpatient Encounter Medications as of 02/14/2022  Medication Sig   Esomeprazole Magnesium (NEXIUM PO) Take by mouth.   No facility-administered encounter medications on file as of 02/14/2022.    Past Medical History:  Diagnosis Date   GERD (gastroesophageal reflux disease)     History reviewed. No pertinent surgical history.  Family History  Problem Relation Age of Onset   Hypertension Father    Stroke Father     Social History   Socioeconomic History   Marital status: Single    Spouse name: Not on file   Number of children: 2   Years of education: Not on file   Highest education level: Not on file  Occupational History   Occupation: [employer] doctor  Tobacco Use   Smoking status: Former    Types: Cigarettes    Quit date: 06/13/2015    Years since quitting: 6.6   Smokeless tobacco: Former  Substance and Sexual Activity   Alcohol use: Yes    Comment: rarely   Drug use: Never   Sexual activity: Yes  Other Topics Concern   Not on file  Social History Narrative   Pt originally from [place].   Has 1 brother lives behind him.   Has 2 sons 1 in [place] and 1 in [place]   Has 2 grandchildren   Social Determinants of Health   Financial Resource Strain: Not on file  Food Insecurity:  Not on file  Transportation Needs: No Transportation Needs (02/14/2022)   PRAPARE - Administrator, Civil Service (Medical): No    Lack of Transportation (Non-Medical): No  Physical Activity: Not on file  Stress: Not on file  Social Connections: Not on file  Intimate Partner Violence: Not on file    Review of Systems  Constitutional:  Negative for chills and fever.  HENT:  Negative for ear pain and hearing loss.   Cardiovascular:  Negative for chest pain and claudication.  Gastrointestinal:  Positive for heartburn. Negative for abdominal pain, blood in stool, constipation, diarrhea, nausea and vomiting.       Occasional GERD using PPI prn  Genitourinary:  Negative for dysuria and urgency.  Musculoskeletal:  Negative for back pain and neck pain.  Skin:  Negative for itching and rash.  Neurological:  Negative for dizziness, weakness and headaches.  Psychiatric/Behavioral:  Negative for depression and suicidal ideas.   All other systems reviewed and are negative.       Objective    BP (!) 134/92   Pulse 77   Temp 98.2 F (36.8 C)   Ht 5\' 10"  (1.778 m)   Wt 212 lb 9 oz (96.4  kg)   SpO2 98%   BMI 30.50 kg/m   Physical Exam Vitals and nursing note reviewed.  Constitutional:      Appearance: Normal appearance. He is normal weight.  HENT:     Head: Normocephalic and atraumatic.     Right Ear: Tympanic membrane, ear canal and external ear normal.     Left Ear: Tympanic membrane, ear canal and external ear normal.     Nose: Nose normal.     Mouth/Throat:     Mouth: Mucous membranes are dry.     Pharynx: Oropharynx is clear.  Eyes:     Extraocular Movements: Extraocular movements intact.     Conjunctiva/sclera: Conjunctivae normal.     Pupils: Pupils are equal, round, and reactive to light.  Cardiovascular:     Rate and Rhythm: Normal rate and regular rhythm.     Pulses: Normal pulses.     Heart sounds: Normal heart sounds.  Pulmonary:     Effort:  Pulmonary effort is normal.     Breath sounds: Normal breath sounds.  Abdominal:     General: Abdomen is flat. Bowel sounds are normal.     Palpations: Abdomen is soft.  Musculoskeletal:        General: Normal range of motion.     Cervical back: Normal range of motion and neck supple.  Skin:    General: Skin is warm.     Capillary Refill: Capillary refill takes less than 2 seconds.  Neurological:     General: No focal deficit present.     Mental Status: He is alert and oriented to person, place, and time. Mental status is at baseline.  Psychiatric:        Mood and Affect: Mood normal.        Behavior: Behavior normal.        Thought Content: Thought content normal.        Judgment: Judgment normal.        Assessment & Plan:   Problem List Items Addressed This Visit       Digestive   GERD (gastroesophageal reflux disease)   Relevant Medications   Esomeprazole Magnesium (NEXIUM PO)   Other Visit Diagnoses     Annual physical exam    -  Primary   Encounter to establish care with new doctor       Impaired glucose tolerance       Relevant Orders   CBC with Differential/Platelet   Comprehensive metabolic panel   Hemoglobin A1c   Need for hepatitis C screening test       Relevant Orders   Hepatitis C antibody   Encounter for lipid screening for cardiovascular disease       Relevant Orders   Lipid panel   Screening PSA (prostate specific antigen)       Relevant Orders   PSA   Need for influenza vaccination       Relevant Orders   Flu Vaccine QUAD 60mo+IM (Fluarix, Fluzone & Alfiuria Quad PF) (Completed)   Screening for viral disease       Relevant Orders   HIV Antibody (routine testing w rflx)     Screening annual exam today Flu vaccine Defers colonoscopy and cologuard for now   Return in about 1 year (around 02/15/2023) for Annual Physical.   Suzan Slick, MD

## 2022-02-14 NOTE — Patient Instructions (Addendum)
HAPPY BIRTHDAY!!!

## 2022-02-15 ENCOUNTER — Other Ambulatory Visit: Payer: Self-pay | Admitting: Family Medicine

## 2022-02-15 DIAGNOSIS — E782 Mixed hyperlipidemia: Secondary | ICD-10-CM

## 2022-02-15 LAB — CBC WITH DIFFERENTIAL/PLATELET
Basophils Absolute: 0.1 10*3/uL (ref 0.0–0.2)
Basos: 1 %
EOS (ABSOLUTE): 0.2 10*3/uL (ref 0.0–0.4)
Eos: 3 %
Hematocrit: 42.9 % (ref 37.5–51.0)
Hemoglobin: 14.5 g/dL (ref 13.0–17.7)
Immature Grans (Abs): 0 10*3/uL (ref 0.0–0.1)
Immature Granulocytes: 0 %
Lymphocytes Absolute: 1.6 10*3/uL (ref 0.7–3.1)
Lymphs: 24 %
MCH: 28.3 pg (ref 26.6–33.0)
MCHC: 33.8 g/dL (ref 31.5–35.7)
MCV: 84 fL (ref 79–97)
Monocytes Absolute: 0.3 10*3/uL (ref 0.1–0.9)
Monocytes: 4 %
Neutrophils Absolute: 4.7 10*3/uL (ref 1.4–7.0)
Neutrophils: 68 %
Platelets: 309 10*3/uL (ref 150–450)
RBC: 5.13 x10E6/uL (ref 4.14–5.80)
RDW: 12.3 % (ref 11.6–15.4)
WBC: 6.9 10*3/uL (ref 3.4–10.8)

## 2022-02-15 LAB — HEMOGLOBIN A1C
Est. average glucose Bld gHb Est-mCnc: 126 mg/dL
Hgb A1c MFr Bld: 6 % — ABNORMAL HIGH (ref 4.8–5.6)

## 2022-02-15 LAB — LIPID PANEL
Chol/HDL Ratio: 5.6 ratio — ABNORMAL HIGH (ref 0.0–5.0)
Cholesterol, Total: 186 mg/dL (ref 100–199)
HDL: 33 mg/dL — ABNORMAL LOW (ref >39)
LDL Chol Calc (NIH): 123 mg/dL — ABNORMAL HIGH (ref 0–99)
Triglycerides: 167 mg/dL — ABNORMAL HIGH (ref 0–149)
VLDL Cholesterol Cal: 30 mg/dL (ref 5–40)

## 2022-02-15 LAB — COMPREHENSIVE METABOLIC PANEL
ALT: 18 IU/L (ref 0–44)
AST: 17 IU/L (ref 0–40)
Albumin/Globulin Ratio: 1.4 (ref 1.2–2.2)
Albumin: 4.2 g/dL (ref 3.8–4.9)
Alkaline Phosphatase: 86 IU/L (ref 44–121)
BUN/Creatinine Ratio: 10 (ref 9–20)
BUN: 12 mg/dL (ref 6–24)
Bilirubin Total: 0.8 mg/dL (ref 0.0–1.2)
CO2: 22 mmol/L (ref 20–29)
Calcium: 9.1 mg/dL (ref 8.7–10.2)
Chloride: 103 mmol/L (ref 96–106)
Creatinine, Ser: 1.26 mg/dL (ref 0.76–1.27)
Globulin, Total: 3 g/dL (ref 1.5–4.5)
Glucose: 109 mg/dL — ABNORMAL HIGH (ref 70–99)
Potassium: 4.7 mmol/L (ref 3.5–5.2)
Sodium: 141 mmol/L (ref 134–144)
Total Protein: 7.2 g/dL (ref 6.0–8.5)
eGFR: 69 mL/min/{1.73_m2} (ref >59)

## 2022-02-15 LAB — HEPATITIS C ANTIBODY: Hep C Virus Ab: NONREACTIVE

## 2022-02-15 LAB — HIV ANTIBODY (ROUTINE TESTING W REFLEX): HIV Screen 4th Generation wRfx: NONREACTIVE

## 2022-02-15 LAB — PSA: Prostate Specific Ag, Serum: 0.6 ng/mL (ref 0.0–4.0)

## 2022-02-15 MED ORDER — ATORVASTATIN CALCIUM 10 MG PO TABS: 10.0000 mg | ORAL_TABLET | Freq: Every day | ORAL | 1 refills | Status: AC

## 2023-02-18 ENCOUNTER — Ambulatory Visit (INDEPENDENT_AMBULATORY_CARE_PROVIDER_SITE_OTHER): Payer: Managed Care, Other (non HMO) | Admitting: Family Medicine

## 2023-02-18 ENCOUNTER — Encounter: Payer: Self-pay | Admitting: Family Medicine

## 2023-02-18 VITALS — BP 142/85 | HR 67 | Temp 98.1°F | Resp 18 | Ht 70.0 in | Wt 214.1 lb

## 2023-02-18 DIAGNOSIS — Z136 Encounter for screening for cardiovascular disorders: Secondary | ICD-10-CM | POA: Diagnosis not present

## 2023-02-18 DIAGNOSIS — Z23 Encounter for immunization: Secondary | ICD-10-CM | POA: Diagnosis not present

## 2023-02-18 DIAGNOSIS — Z13220 Encounter for screening for lipoid disorders: Secondary | ICD-10-CM

## 2023-02-18 DIAGNOSIS — R7302 Impaired glucose tolerance (oral): Secondary | ICD-10-CM

## 2023-02-18 DIAGNOSIS — Z0000 Encounter for general adult medical examination without abnormal findings: Secondary | ICD-10-CM

## 2023-02-18 NOTE — Progress Notes (Signed)
Complete physical exam  Patient: Alex Wyatt   DOB: May 19, 1970   52 y.o. Male  MRN: [id number]  Subjective:    Chief Complaint  Patient presents with   Annual Exam    Alex Wyatt is a 52 y.o. male who presents today for a complete physical exam. He reports consuming a general diet. The patient does not participate in regular exercise at present. He generally feels well. He reports sleeping well. He does not have additional problems to discuss today.  Pt would  like flu vaccine.  Declines colonoscopy or cologuard today.  Most recent fall risk assessment:    02/14/2022    8:14 AM  Fall Risk   Falls in the past year? 0  Number falls in past yr: 0  Injury with Fall? 0  Risk for fall due to : No Fall Risks  Follow up Falls evaluation completed     Most recent depression screenings:    02/14/2022    8:14 AM 08/04/2019    9:13 AM  PHQ 2/9 Scores  PHQ - 2 Score 0 0  PHQ- 9 Score 2 0    Vision:Not within last year   Patient Active Problem List   Diagnosis Date Noted   GERD (gastroesophageal reflux disease) 02/14/2022   Elevated blood pressure reading 06/18/2018   Past Medical History:  Diagnosis Date   GERD (gastroesophageal reflux disease)    Social History   Tobacco Use   Smoking status: Former    Current packs/day: 0.00    Types: Cigarettes    Quit date: 06/13/2015    Years since quitting: 7.6   Smokeless tobacco: Former  Substance Use Topics   Alcohol use: Yes    Comment: rarely   Drug use: Never   Family Status  Relation Name Status   Mother  Alive   Father  Alive   Brother  Alive  No partnership data on file   No Known Allergies    Patient Care Team: Suzan Slick, MD as PCP - General (Family Medicine)   Outpatient Medications Prior to Visit  Medication Sig   atorvastatin (LIPITOR) 10 MG tablet Take 1 tablet (10 mg total) by mouth daily.   Esomeprazole Magnesium (NEXIUM PO) Take by mouth.   No facility-administered  medications prior to visit.    Review of Systems  All other systems reviewed and are negative.        Objective:     BP (!) 142/85   Pulse 67   Temp 98.1 F (36.7 C) (Oral)   Resp 18   Ht 5\' 10"  (1.778 m)   Wt 214 lb 1.6 oz (97.1 kg)   SpO2 97%   BMI 30.72 kg/m  BP Readings from Last 3 Encounters:  02/18/23 (!) 142/85  02/14/22 (!) 134/92  08/04/19 124/85      Physical Exam Vitals and nursing note reviewed.  Constitutional:      Appearance: Normal appearance. He is normal weight.  HENT:     Head: Normocephalic and atraumatic.     Right Ear: Tympanic membrane, ear canal and external ear normal.     Left Ear: Tympanic membrane, ear canal and external ear normal.     Nose: Nose normal.     Mouth/Throat:     Mouth: Mucous membranes are moist.     Pharynx: Oropharynx is clear.  Eyes:     Conjunctiva/sclera: Conjunctivae normal.     Pupils: Pupils are equal, round, and reactive to light.  Cardiovascular:     Rate and Rhythm: Normal rate and regular rhythm.     Pulses: Normal pulses.     Heart sounds: Normal heart sounds.  Pulmonary:     Effort: Pulmonary effort is normal.     Breath sounds: Normal breath sounds.  Abdominal:     General: Abdomen is flat. Bowel sounds are normal.  Skin:    General: Skin is warm.     Capillary Refill: Capillary refill takes less than 2 seconds.  Neurological:     General: No focal deficit present.     Mental Status: He is alert and oriented to person, place, and time. Mental status is at baseline.  Psychiatric:        Mood and Affect: Mood normal.        Behavior: Behavior normal.        Thought Content: Thought content normal.        Judgment: Judgment normal.     No results found for any visits on 02/18/23. Last CBC Lab Results  Component Value Date   WBC 6.9 02/14/2022   HGB 14.5 02/14/2022   HCT 42.9 02/14/2022   MCV 84 02/14/2022   MCH 28.3 02/14/2022   RDW 12.3 02/14/2022   PLT 309 02/14/2022   Last  metabolic panel Lab Results  Component Value Date   GLUCOSE 109 (H) 02/14/2022   NA 141 02/14/2022   K 4.7 02/14/2022   CL 103 02/14/2022   CO2 22 02/14/2022   BUN 12 02/14/2022   CREATININE 1.26 02/14/2022   EGFR 69 02/14/2022   CALCIUM 9.1 02/14/2022   PROT 7.2 02/14/2022   ALBUMIN 4.2 02/14/2022   LABGLOB 3.0 02/14/2022   AGRATIO 1.4 02/14/2022   BILITOT 0.8 02/14/2022   ALKPHOS 86 02/14/2022   AST 17 02/14/2022   ALT 18 02/14/2022   Last lipids Lab Results  Component Value Date   CHOL 186 02/14/2022   HDL 33 (L) 02/14/2022   LDLCALC 123 (H) 02/14/2022   TRIG 167 (H) 02/14/2022   CHOLHDL 5.6 (H) 02/14/2022   Last hemoglobin A1c Lab Results  Component Value Date   HGBA1C 6.0 (H) 02/14/2022        Assessment & Plan:    Routine Health Maintenance and Physical Exam  Immunization History  Administered Date(s) Administered   Influenza,inj,Quad PF,6+ Mos 01/04/2015, 01/05/2018, 12/07/2018, 02/14/2022   Influenza-Unspecified 11/03/2016   PFIZER(Purple Top)SARS-COV-2 Vaccination 06/11/2019, 07/02/2019   Pfizer Covid-19 Vaccine Bivalent Booster 42yrs & up 03/02/2020   Tdap 01/04/2015    Health Maintenance  Topic Date Due   Colonoscopy  Never done   Zoster Vaccines- Shingrix (1 of 2) Never done   INFLUENZA VACCINE  10/04/2022   COVID-19 Vaccine (4 - 2024-25 season) 11/04/2022   DTaP/Tdap/Td (2 - Td or Tdap) 01/03/2025   Hepatitis C Screening  Completed   HIV Screening  Completed   HPV VACCINES  Aged Out    Discussed health benefits of physical activity, and encouraged him to engage in regular exercise appropriate for his age and condition.  Problem List Items Addressed This Visit   None  No follow-ups on file. Annual physical exam  Encounter for lipid screening for cardiovascular disease -     Lipid panel  Impaired glucose tolerance -     CBC with Differential/Platelet -     Comprehensive metabolic panel -     Hemoglobin A1c  Need for influenza  vaccination -     Flu vaccine trivalent PF,  6mos and older(Flulaval,Afluria,Fluarix,Fluzone)   Screening labs Flu vaccine today Declines colonoscopy or cologuard    Suzan Slick, MD

## 2023-02-19 LAB — COMPREHENSIVE METABOLIC PANEL
ALT: 17 [IU]/L (ref 0–44)
AST: 16 [IU]/L (ref 0–40)
Albumin: 4.3 g/dL (ref 3.8–4.9)
Alkaline Phosphatase: 93 [IU]/L (ref 44–121)
BUN/Creatinine Ratio: 12 (ref 9–20)
BUN: 13 mg/dL (ref 6–24)
Bilirubin Total: 0.8 mg/dL (ref 0.0–1.2)
CO2: 20 mmol/L (ref 20–29)
Calcium: 9.4 mg/dL (ref 8.7–10.2)
Chloride: 103 mmol/L (ref 96–106)
Creatinine, Ser: 1.06 mg/dL (ref 0.76–1.27)
Globulin, Total: 3.1 g/dL (ref 1.5–4.5)
Glucose: 97 mg/dL (ref 70–99)
Potassium: 5 mmol/L (ref 3.5–5.2)
Sodium: 140 mmol/L (ref 134–144)
Total Protein: 7.4 g/dL (ref 6.0–8.5)
eGFR: 84 mL/min/{1.73_m2} (ref >59)

## 2023-02-19 LAB — CBC WITH DIFFERENTIAL/PLATELET
Basophils Absolute: 0.1 10*3/uL (ref 0.0–0.2)
Basos: 1 %
EOS (ABSOLUTE): 0.1 10*3/uL (ref 0.0–0.4)
Eos: 2 %
Hematocrit: 46 % (ref 37.5–51.0)
Hemoglobin: 15.1 g/dL (ref 13.0–17.7)
Immature Grans (Abs): 0 10*3/uL (ref 0.0–0.1)
Immature Granulocytes: 1 %
Lymphocytes Absolute: 1.9 10*3/uL (ref 0.7–3.1)
Lymphs: 29 %
MCH: 28.3 pg (ref 26.6–33.0)
MCHC: 32.8 g/dL (ref 31.5–35.7)
MCV: 86 fL (ref 79–97)
Monocytes Absolute: 0.3 10*3/uL (ref 0.1–0.9)
Monocytes: 4 %
Neutrophils Absolute: 4.3 10*3/uL (ref 1.4–7.0)
Neutrophils: 63 %
Platelets: 310 10*3/uL (ref 150–450)
RBC: 5.33 x10E6/uL (ref 4.14–5.80)
RDW: 12.8 % (ref 11.6–15.4)
WBC: 6.6 10*3/uL (ref 3.4–10.8)

## 2023-02-19 LAB — LIPID PANEL
Chol/HDL Ratio: 4.9 {ratio} (ref 0.0–5.0)
Cholesterol, Total: 191 mg/dL (ref 100–199)
HDL: 39 mg/dL — ABNORMAL LOW (ref >39)
LDL Chol Calc (NIH): 131 mg/dL — ABNORMAL HIGH (ref 0–99)
Triglycerides: 113 mg/dL (ref 0–149)
VLDL Cholesterol Cal: 21 mg/dL (ref 5–40)

## 2023-02-19 LAB — HEMOGLOBIN A1C
Est. average glucose Bld gHb Est-mCnc: 123 mg/dL
Hgb A1c MFr Bld: 5.9 % — ABNORMAL HIGH (ref 4.8–5.6)
